# Patient Record
Sex: Female | Born: 1945 | Race: White | Hispanic: No | Smoking: Never smoker
Health system: Southern US, Community
[De-identification: ages and names within clinical notes are randomized; demographics above are authoritative.]

## PROBLEM LIST (undated history)

## (undated) DIAGNOSIS — I509 Heart failure, unspecified: Secondary | ICD-10-CM

## (undated) DIAGNOSIS — I480 Paroxysmal atrial fibrillation: Secondary | ICD-10-CM

## (undated) DIAGNOSIS — I10 Essential (primary) hypertension: Secondary | ICD-10-CM

## (undated) DIAGNOSIS — E119 Type 2 diabetes mellitus without complications: Secondary | ICD-10-CM

## (undated) DIAGNOSIS — I639 Cerebral infarction, unspecified: Secondary | ICD-10-CM

## (undated) HISTORY — PX: FEMUR SURGERY: SHX943

## (undated) HISTORY — PX: CHOLECYSTECTOMY: SHX55

## (undated) HISTORY — PX: TUBAL LIGATION: SHX77

## (undated) HISTORY — PX: HERNIA REPAIR: SHX51

## (undated) HISTORY — PX: CARPAL TUNNEL RELEASE: SHX101

---

## 2006-01-14 ENCOUNTER — Ambulatory Visit (HOSPITAL_COMMUNITY): Admission: RE | Admit: 2006-01-14 | Discharge: 2006-01-14 | Payer: Self-pay | Admitting: Ophthalmology

## 2006-02-18 ENCOUNTER — Ambulatory Visit (HOSPITAL_COMMUNITY): Admission: RE | Admit: 2006-02-18 | Discharge: 2006-02-18 | Payer: Self-pay | Admitting: Ophthalmology

## 2017-09-17 ENCOUNTER — Other Ambulatory Visit: Payer: Self-pay

## 2017-09-17 ENCOUNTER — Inpatient Hospital Stay (HOSPITAL_COMMUNITY)
Admission: EM | Admit: 2017-09-17 | Discharge: 2017-09-23 | DRG: 871 | Disposition: A | Discharge status: Home Health | Payer: Medicare Other | Attending: Internal Medicine | Admitting: Internal Medicine

## 2017-09-17 ENCOUNTER — Inpatient Hospital Stay (HOSPITAL_COMMUNITY): Payer: Medicare Other

## 2017-09-17 ENCOUNTER — Emergency Department (HOSPITAL_COMMUNITY): Payer: Medicare Other

## 2017-09-17 DIAGNOSIS — Z794 Long term (current) use of insulin: Secondary | ICD-10-CM

## 2017-09-17 DIAGNOSIS — I429 Cardiomyopathy, unspecified: Secondary | ICD-10-CM | POA: Diagnosis not present

## 2017-09-17 DIAGNOSIS — L8992 Pressure ulcer of unspecified site, stage 2: Secondary | ICD-10-CM | POA: Diagnosis not present

## 2017-09-17 DIAGNOSIS — N179 Acute kidney failure, unspecified: Secondary | ICD-10-CM | POA: Diagnosis not present

## 2017-09-17 DIAGNOSIS — A419 Sepsis, unspecified organism: Principal | ICD-10-CM

## 2017-09-17 DIAGNOSIS — E876 Hypokalemia: Secondary | ICD-10-CM | POA: Diagnosis not present

## 2017-09-17 DIAGNOSIS — N3000 Acute cystitis without hematuria: Secondary | ICD-10-CM

## 2017-09-17 DIAGNOSIS — Z87892 Personal history of anaphylaxis: Secondary | ICD-10-CM

## 2017-09-17 DIAGNOSIS — N183 Chronic kidney disease, stage 3 (moderate): Secondary | ICD-10-CM | POA: Diagnosis present

## 2017-09-17 DIAGNOSIS — N390 Urinary tract infection, site not specified: Secondary | ICD-10-CM | POA: Diagnosis present

## 2017-09-17 DIAGNOSIS — I129 Hypertensive chronic kidney disease with stage 1 through stage 4 chronic kidney disease, or unspecified chronic kidney disease: Secondary | ICD-10-CM | POA: Diagnosis present

## 2017-09-17 DIAGNOSIS — R6520 Severe sepsis without septic shock: Secondary | ICD-10-CM | POA: Diagnosis present

## 2017-09-17 DIAGNOSIS — L8990 Pressure ulcer of unspecified site, unspecified stage: Secondary | ICD-10-CM

## 2017-09-17 DIAGNOSIS — F0390 Unspecified dementia without behavioral disturbance: Secondary | ICD-10-CM | POA: Diagnosis present

## 2017-09-17 DIAGNOSIS — Z8249 Family history of ischemic heart disease and other diseases of the circulatory system: Secondary | ICD-10-CM

## 2017-09-17 DIAGNOSIS — I2510 Atherosclerotic heart disease of native coronary artery without angina pectoris: Secondary | ICD-10-CM | POA: Diagnosis present

## 2017-09-17 DIAGNOSIS — Z881 Allergy status to other antibiotic agents status: Secondary | ICD-10-CM

## 2017-09-17 DIAGNOSIS — G9340 Encephalopathy, unspecified: Secondary | ICD-10-CM

## 2017-09-17 DIAGNOSIS — L89152 Pressure ulcer of sacral region, stage 2: Secondary | ICD-10-CM | POA: Diagnosis present

## 2017-09-17 DIAGNOSIS — K219 Gastro-esophageal reflux disease without esophagitis: Secondary | ICD-10-CM | POA: Diagnosis present

## 2017-09-17 DIAGNOSIS — S72452D Displaced supracondylar fracture without intracondylar extension of lower end of left femur, subsequent encounter for closed fracture with routine healing: Secondary | ICD-10-CM

## 2017-09-17 DIAGNOSIS — K439 Ventral hernia without obstruction or gangrene: Secondary | ICD-10-CM | POA: Diagnosis present

## 2017-09-17 DIAGNOSIS — J9811 Atelectasis: Secondary | ICD-10-CM | POA: Diagnosis present

## 2017-09-17 DIAGNOSIS — I480 Paroxysmal atrial fibrillation: Secondary | ICD-10-CM | POA: Diagnosis present

## 2017-09-17 DIAGNOSIS — D509 Iron deficiency anemia, unspecified: Secondary | ICD-10-CM | POA: Diagnosis present

## 2017-09-17 DIAGNOSIS — R4702 Dysphasia: Secondary | ICD-10-CM | POA: Diagnosis present

## 2017-09-17 DIAGNOSIS — Z955 Presence of coronary angioplasty implant and graft: Secondary | ICD-10-CM

## 2017-09-17 DIAGNOSIS — I10 Essential (primary) hypertension: Secondary | ICD-10-CM | POA: Diagnosis present

## 2017-09-17 DIAGNOSIS — G9341 Metabolic encephalopathy: Secondary | ICD-10-CM | POA: Diagnosis present

## 2017-09-17 DIAGNOSIS — Z886 Allergy status to analgesic agent status: Secondary | ICD-10-CM | POA: Diagnosis not present

## 2017-09-17 DIAGNOSIS — R1310 Dysphagia, unspecified: Secondary | ICD-10-CM | POA: Diagnosis present

## 2017-09-17 DIAGNOSIS — R1011 Right upper quadrant pain: Secondary | ICD-10-CM

## 2017-09-17 DIAGNOSIS — E1122 Type 2 diabetes mellitus with diabetic chronic kidney disease: Secondary | ICD-10-CM | POA: Diagnosis present

## 2017-09-17 DIAGNOSIS — I639 Cerebral infarction, unspecified: Secondary | ICD-10-CM

## 2017-09-17 DIAGNOSIS — Z888 Allergy status to other drugs, medicaments and biological substances status: Secondary | ICD-10-CM

## 2017-09-17 DIAGNOSIS — Z91048 Other nonmedicinal substance allergy status: Secondary | ICD-10-CM

## 2017-09-17 DIAGNOSIS — Z8673 Personal history of transient ischemic attack (TIA), and cerebral infarction without residual deficits: Secondary | ICD-10-CM

## 2017-09-17 DIAGNOSIS — Z9049 Acquired absence of other specified parts of digestive tract: Secondary | ICD-10-CM

## 2017-09-17 DIAGNOSIS — R410 Disorientation, unspecified: Secondary | ICD-10-CM | POA: Diagnosis not present

## 2017-09-17 DIAGNOSIS — Z7901 Long term (current) use of anticoagulants: Secondary | ICD-10-CM

## 2017-09-17 DIAGNOSIS — Z882 Allergy status to sulfonamides status: Secondary | ICD-10-CM

## 2017-09-17 DIAGNOSIS — Z885 Allergy status to narcotic agent status: Secondary | ICD-10-CM

## 2017-09-17 HISTORY — DX: Heart failure, unspecified: I50.9

## 2017-09-17 HISTORY — DX: Essential (primary) hypertension: I10

## 2017-09-17 HISTORY — DX: Paroxysmal atrial fibrillation: I48.0

## 2017-09-17 HISTORY — DX: Cerebral infarction, unspecified: I63.9

## 2017-09-17 HISTORY — DX: Type 2 diabetes mellitus without complications: E11.9

## 2017-09-17 LAB — CBC WITH DIFFERENTIAL/PLATELET
Basophils Absolute: 0 10*3/uL (ref 0.0–0.1)
Basophils Relative: 0 %
EOS ABS: 0 10*3/uL (ref 0.0–0.7)
Eosinophils Relative: 0 %
HCT: 35.1 % — ABNORMAL LOW (ref 36.0–46.0)
Hemoglobin: 11.5 g/dL — ABNORMAL LOW (ref 12.0–15.0)
LYMPHS ABS: 1.8 10*3/uL (ref 0.7–4.0)
Lymphocytes Relative: 17 %
MCH: 27.6 pg (ref 26.0–34.0)
MCHC: 32.8 g/dL (ref 30.0–36.0)
MCV: 84.2 fL (ref 78.0–100.0)
Monocytes Absolute: 0.6 10*3/uL (ref 0.1–1.0)
Monocytes Relative: 6 %
Neutro Abs: 8 10*3/uL — ABNORMAL HIGH (ref 1.7–7.7)
Neutrophils Relative %: 77 %
Platelets: 245 10*3/uL (ref 150–400)
RBC: 4.17 MIL/uL (ref 3.87–5.11)
RDW: 19.5 % — ABNORMAL HIGH (ref 11.5–15.5)
WBC: 10.4 10*3/uL (ref 4.0–10.5)

## 2017-09-17 LAB — URINALYSIS, ROUTINE W REFLEX MICROSCOPIC
Bilirubin Urine: NEGATIVE
Glucose, UA: NEGATIVE mg/dL
Ketones, ur: 5 mg/dL — AB
Nitrite: NEGATIVE
PROTEIN: 30 mg/dL — AB
Specific Gravity, Urine: 1.019 (ref 1.005–1.030)
pH: 5 (ref 5.0–8.0)

## 2017-09-17 LAB — COMPREHENSIVE METABOLIC PANEL
ALK PHOS: 151 U/L — AB (ref 38–126)
ALT: 42 U/L (ref 14–54)
AST: 133 U/L — AB (ref 15–41)
Albumin: 2.4 g/dL — ABNORMAL LOW (ref 3.5–5.0)
Anion gap: 15 (ref 5–15)
BUN: 20 mg/dL (ref 6–20)
CALCIUM: 8.3 mg/dL — AB (ref 8.9–10.3)
CHLORIDE: 97 mmol/L — AB (ref 101–111)
CO2: 26 mmol/L (ref 22–32)
CREATININE: 1.48 mg/dL — AB (ref 0.44–1.00)
GFR calc Af Amer: 41 mL/min — ABNORMAL LOW (ref >60)
GFR, EST NON AFRICAN AMERICAN: 35 mL/min — AB (ref >60)
Glucose, Bld: 128 mg/dL — ABNORMAL HIGH (ref 65–99)
Potassium: 4.8 mmol/L (ref 3.5–5.1)
Sodium: 138 mmol/L (ref 135–145)
Total Bilirubin: 1.9 mg/dL — ABNORMAL HIGH (ref 0.3–1.2)
Total Protein: 5.9 g/dL — ABNORMAL LOW (ref 6.5–8.1)

## 2017-09-17 LAB — I-STAT CG4 LACTIC ACID, ED
Lactic Acid, Venous: 2.71 mmol/L (ref 0.5–1.9)
Lactic Acid, Venous: 2.2 mmol/L (ref 0.5–1.9)

## 2017-09-17 MED ORDER — SODIUM CHLORIDE 0.9 % IV BOLUS (SEPSIS)
1000.0000 mL | Freq: Once | INTRAVENOUS | Status: AC
  Administered 2017-09-17: 1000 mL via INTRAVENOUS

## 2017-09-17 MED ORDER — PIPERACILLIN-TAZOBACTAM 3.375 G IVPB 30 MIN
3.3750 g | Freq: Once | INTRAVENOUS | Status: AC
  Administered 2017-09-17: 3.375 g via INTRAVENOUS
  Filled 2017-09-17: qty 50

## 2017-09-17 MED ORDER — SODIUM CHLORIDE 0.9 % IV BOLUS (SEPSIS)
500.0000 mL | Freq: Once | INTRAVENOUS | Status: AC
  Administered 2017-09-17: 500 mL via INTRAVENOUS

## 2017-09-17 MED ORDER — HALOPERIDOL LACTATE 5 MG/ML IJ SOLN
2.0000 mg | Freq: Once | INTRAMUSCULAR | Status: AC
  Administered 2017-09-17: 2 mg via INTRAVENOUS
  Filled 2017-09-17: qty 1

## 2017-09-17 MED ORDER — VANCOMYCIN HCL IN DEXTROSE 1-5 GM/200ML-% IV SOLN
1000.0000 mg | Freq: Once | INTRAVENOUS | Status: AC
  Administered 2017-09-17: 1000 mg via INTRAVENOUS
  Filled 2017-09-17: qty 200

## 2017-09-17 NOTE — ED Triage Notes (Signed)
Per EMS: Pt was at [facility] [place]. Pt had a PICC line and ripped it out. Pt had dark foul smelling urine in small amounts. The facility was unable to maintain IV access and it is unsure if they ever finished antibiotics for the IV  O2 95% on RA End title CO2 12-15 CBG 176  Pt is screaming into the hall and not making sense at this time. Pt has had 9 UTI's in the past 6 month. Pt also has a rod in her leg from a femur fracture approximately 6 months ago.

## 2017-09-17 NOTE — ED Provider Notes (Signed)
[facility]-EMERGENCY DEPT Provider Note   CSN: [id number] Arrival date & time: 09/17/17  1952     History   Chief Complaint Chief Complaint  Patient presents with  . Altered Mental Status    HPI Stacy Dorsey is a 72 y.o. female.  HPI   72 year old female here with altered mental status.  Patient has an extensive history of recurrent delirium secondary to UTIs.  She is been hospitalized at [facility] multiple times at [facility] for this.  Recently, the patient just returned home from a skilled nursing facility 4 days ago.  Since then, she is had recurrence of delirium, agitation, and confusion.  She is been unable to walk.  She has had associated intermittent fevers.  She is not eating and drinking.  She has not been sleeping as well.  Family subsequently brings her in for further evaluation.  She has not complained of any pain.  Level 5 caveat invoked as remainder of history, ROS, and physical exam limited due to patient's AMS.   No past medical history on file.   Osteoarthritis Mild dementia Recent L hip fx s/p repair   Patient Active Problem List   Diagnosis Date Noted  . Sepsis (HCC) 09/17/2017      OB History    No data available       Home Medications    Prior to Admission medications   Medication Sig Start Date End Date Taking? Authorizing Provider  allopurinol (ZYLOPRIM) 100 MG tablet Take 100 mg by mouth daily.  09/13/17  Yes [provider]  atorvastatin (LIPITOR) 80 MG tablet Take 80 mg by mouth daily.  09/13/17  Yes [provider]  dorzolamide (TRUSOPT) 2 % ophthalmic solution Place 1 drop into both eyes 3 (three) times daily.  09/13/17  Yes [provider]  ELIQUIS 5 MG TABS tablet Take 5 mg by mouth 2 (two) times daily.  09/13/17  Yes [provider]  metoprolol tartrate (LOPRESSOR) 25 MG tablet Take 12.5 mg by mouth 2 (two) times daily.  09/13/17  Yes [provider]  NOVOLOG 100 UNIT/ML  injection Inject 0-12 Units into the skin 4 (four) times daily. 0-150=0 units, 151-200=2 units, 201-250=4 units, 251-300=6 units, 301-350=8 units, 351-400=10 units, 401-450=12 units. 09/13/17  Yes [provider]  OLANZapine (ZYPREXA) 5 MG tablet Take 5 mg by mouth daily.  09/13/17  Yes [provider]  omeprazole (PRILOSEC) 20 MG capsule Take 20 mg by mouth 2 (two) times daily before a meal.  09/13/17  Yes [provider]  nitroGLYCERIN (NITROSTAT) 0.4 MG SL tablet Place 0.4 mg under the tongue every 5 (five) minutes as needed for chest pain.  09/13/17   [provider]    Family History No family history on file.  Social History Social History   Tobacco Use  . Smoking status: Not on file  Substance Use Topics  . Alcohol use: Not on file  . Drug use: Not on file     Allergies   Allegra [fexofenadine]; Carvedilol; Sulfamethoxazole-trimethoprim; Sulfanilamide; Adhesive [tape]; Ceftriaxone; Cephalexin; Clindamycin/lincomycin; Clopidogrel; Ezetimibe; Haloperidol and related; Hydrocodone; Insulin degludec; Insulin glargine; Isosorbide mononitrate [isosorbide dinitrate er]; Moxifloxacin; Other; Prasugrel; Ramipril; Rosuvastatin; Tylenol [acetaminophen]; Atorvastatin calcium; and Minocycline   Review of Systems Review of Systems  Unable to perform ROS: Mental status change  Constitutional: Positive for fatigue and fever.  Psychiatric/Behavioral: Positive for confusion.     Physical Exam Updated Vital Signs BP 109/73 (BP Location: Left Arm)   Pulse Marland Kitchen)  108   Temp (!) 101.1 F (38.4 C) (Rectal)   Resp 15   Ht 5\' 5"  (1.651 m)   Wt 78 kg (172 lb)   SpO2 97%   BMI 28.62 kg/m   Physical Exam  Constitutional: She appears well-developed.  Elderly, appears older than stated age, distressed, delirious  HENT:  Head: Normocephalic and atraumatic.  Markedly dry mucous membranes  Eyes: Conjunctivae are normal.  Neck: Neck supple.  Cardiovascular: Regular  rhythm and normal heart sounds. Exam reveals no friction rub.  No murmur heard. Mildly tachycardic  Pulmonary/Chest: Effort normal. No respiratory distress. She has no wheezes. She has rales (Bibasilar).  Abdominal: Soft. She exhibits no distension.  Musculoskeletal: She exhibits edema (Trace, bilateral lower extremities).  Neurological: She is alert. She exhibits normal muscle tone.  Alert, but markedly delirious.  Waxing and waning mental status.  Speech appears normal.  Moving all extremities.  Skin: Skin is warm. Capillary refill takes less than 2 seconds.  Left femur surgical scars clean, dry, and intact  Nursing note and vitals reviewed.    ED Treatments / Results  Labs (all labs ordered are listed, but only abnormal results are displayed) Labs Reviewed  COMPREHENSIVE METABOLIC PANEL - Abnormal; Notable for the following components:      Result Value   Chloride 97 (*)    Glucose, Bld 128 (*)    Creatinine, Ser 1.48 (*)    Calcium 8.3 (*)    Total Protein 5.9 (*)    Albumin 2.4 (*)    AST 133 (*)    Alkaline Phosphatase 151 (*)    Total Bilirubin 1.9 (*)    GFR calc non Af Amer 35 (*)    GFR calc Af Amer 41 (*)    All other components within normal limits  CBC WITH DIFFERENTIAL/PLATELET - Abnormal; Notable for the following components:   Hemoglobin 11.5 (*)    HCT 35.1 (*)    RDW 19.5 (*)    Neutro Abs 8.0 (*)    All other components within normal limits  URINALYSIS, ROUTINE W REFLEX MICROSCOPIC - Abnormal; Notable for the following components:   Color, Urine AMBER (*)    APPearance CLOUDY (*)    Hgb urine dipstick SMALL (*)    Ketones, ur 5 (*)    Protein, ur 30 (*)    Leukocytes, UA LARGE (*)    Bacteria, UA FEW (*)    Squamous Epithelial / LPF 6-30 (*)    Non Squamous Epithelial 0-5 (*)    All other components within normal limits  I-STAT CG4 LACTIC ACID, ED - Abnormal; Notable for the following components:   Lactic Acid, Venous 2.71 (*)    All other  components within normal limits  I-STAT CG4 LACTIC ACID, ED - Abnormal; Notable for the following components:   Lactic Acid, Venous 2.20 (*)    All other components within normal limits  CULTURE, BLOOD (ROUTINE X 2)  CULTURE, BLOOD (ROUTINE X 2)  URINE CULTURE    EKG  EKG Interpretation  Date/Time:  Tuesday September 17 2017 21:57:27 EST Ventricular Rate:  103 PR Interval:    QRS Duration: 121 QT Interval:  389 QTC Calculation: 510 R Axis:   -98 Text Interpretation:  Sinus tachycardia Ventricular premature complex Aberrant conduction of SV complex(es) IVCD, consider atypical RBBB Inferior infarct, old Abnormal lateral Q waves Anterior infarct, old No significant change since last tracing Confirmed by Shaune Pollack [phone]) on 09/17/2017 11:24:33 PM  Radiology Dg Chest Port 1 View  Result Date: 09/17/2017 CLINICAL DATA:  Altered mental status.  Sepsis. EXAM: PORTABLE CHEST 1 VIEW COMPARISON:  08/08/2017 FINDINGS: Patient rotated left. Artifact projects over the medial upper right lung. Cardiomegaly accentuated by AP portable technique. Probable small bilateral pleural effusions. No pneumothorax. Persistent bibasilar airspace disease. Low lung volumes with resultant pulmonary interstitial prominence. IMPRESSION: Probable small bilateral pleural effusions. Bibasilar airspace disease could represent persistent or recurrent atelectasis versus infection. Cardiomegaly and low lung volumes.  No overt congestive failure. Electronically Signed   By: Jeronimo Greaves M.D.   On: 09/17/2017 20:43   US Abdomen Limited Ruq  Result Date: 09/17/2017 CLINICAL DATA:  Right upper quadrant pain. EXAM: ULTRASOUND ABDOMEN LIMITED RIGHT UPPER QUADRANT COMPARISON:  None. FINDINGS: Gallbladder: Surgically absent. Common bile duct: Diameter: 6 mm. Liver: Technically limited exam due to patient motion and inability to hold position. No focal lesion identified. Grossly within normal limits in parenchymal echogenicity.  Portal vein is patent on color Doppler imaging with normal direction of blood flow towards the liver. IMPRESSION: 1. Postcholecystectomy without biliary dilatation. 2. No gross focal hepatic abnormality demonstrated sonographically. Electronically Signed   By: Rubye Oaks M.D.   On: 09/17/2017 23:44    Procedures .Critical Care Performed by: Shaune Pollack, MD Authorized by: Shaune Pollack, MD   Critical care provider statement:    Critical care time (minutes):  45   Critical care time was exclusive of:  Separately billable procedures and treating other patients and teaching time   Critical care was necessary to treat or prevent imminent or life-threatening deterioration of the following conditions:  Circulatory failure, sepsis and dehydration   Critical care was time spent personally by me on the following activities:  Development of treatment plan with patient or surrogate, discussions with consultants, evaluation of patient's response to treatment, examination of patient, obtaining history from patient or surrogate, ordering and performing treatments and interventions, ordering and review of laboratory studies, ordering and review of radiographic studies, pulse oximetry, re-evaluation of patient's condition and review of old charts   I assumed direction of critical care for this patient from another provider in my specialty: no     (including critical care time)  Emergency Ultrasound Study:   Angiocath insertion Performed by: Dollene Cleveland Consent: Verbal consent/emergent consent obtained. Risks and benefits: risks, benefits and alternatives were discussed Immediately prior to procedure the correct patient, procedure, equipment, support staff and site/side marked as needed.  Indication: difficult IV access Preparation: Patient was prepped and draped in the usual sterile fashion. Sterile gel was used for this procedure and the ultrasound probe was sterilized prior to use. Vein  Location: Right upper arm vein was visualized during assessment for potential access sites and was found to be patent/ easily compressed with linear ultrasound.  The needle was visualized with real-time ultrasound and guided into the vein. Gauge: 20 Image saved and stored.  Normal blood return.   Patient tolerance: Patient tolerated the procedure well with no immediate complications.  Emergency Ultrasound Study:   Angiocath insertion Performed by: Dollene Cleveland Consent: Verbal consent/emergent consent obtained. Risks and benefits: risks, benefits and alternatives were discussed Immediately prior to procedure the correct patient, procedure, equipment, support staff and site/side marked as needed.  Indication: difficult IV access Preparation: Patient was prepped and draped in the usual sterile fashion. Sterile gel was used for this procedure and the ultrasound probe was sterilized prior to use. Vein Location: Right forearm vein was visualized during assessment  for potential access sites and was found to be patent/ easily compressed with linear ultrasound.  The needle was visualized with real-time ultrasound and guided into the vein. Gauge: 20  Image saved and stored.  Normal blood return.   Patient tolerance: Patient tolerated the procedure well with no immediate complications.           Medications Ordered in ED Medications  sodium chloride 0.9 % bolus 500 mL (not administered)  sodium chloride 0.9 % bolus 1,000 mL (0 mLs Intravenous Stopped 09/17/17 2223)  piperacillin-tazobactam (ZOSYN) IVPB 3.375 g (0 g Intravenous Stopped 09/17/17 2146)  vancomycin (VANCOCIN) IVPB 1000 mg/200 mL premix (0 mg Intravenous Stopped 09/17/17 2309)  sodium chloride 0.9 % bolus 1,000 mL (0 mLs Intravenous Stopped 09/17/17 2230)  haloperidol lactate (HALDOL) injection 2 mg (2 mg Intravenous Given 09/17/17 2131)     Initial Impression / Assessment and Plan / ED Course  I have reviewed the triage vital  signs and the nursing notes.  Pertinent labs & imaging results that were available during my care of the patient were reviewed by me and considered in my medical decision making (see chart for details).    72 yo F with PMHx recurrent UTI, recent L femoral fx s/p repair, here with AMS. Pt febrile, tachycardic, mildly hypotensive on arrival. CODE SEPSIS initiated, but 30 cc/kg initially delayed 2/2 difficult IV access. I placed a R upper arm IV that obtained labs, received fluids briefly but pt grabbed and removed. Second IV placed by myself. Left hand IV placed by EMS. Concern for sepsis 2/2 UTI, with profound delirium. No focal neuro deficits, no recent falls or s/s to suggest intracranial abnormality. Pt has h/o similar presentation 2/2 UTI. Will continue fluids, broad ABX, and re-assess.  Labs show moderate lactic acidosis, AKI. Pt given fluids. Of note, tp with elevated bili, AST -suspect this is 2/2 sepsis and dehydration, but RUQ U/S ordered. Will also check CT per Hospitalist, though abdomen is soft, NT, ND. Labs are o/w consistent with UTI and severe sepsis. Fluids given.  Sepsis re-eval completed. Pt improving with perfusion, BP stable, HR improving. She remains confused. Unable to give tylenol for fever 2/2 allergy. Admit to step down. Of note, gave 2 mg haldol with moderate effect but hesitant to give more 2/2 prolonged QT. Pt has had paradoxical reaction to Ativan in past.   Final Clinical Impressions(s) / ED Diagnoses   Final diagnoses:  RUQ pain  Severe sepsis (HCC)  Acute cystitis without hematuria  Delirium    ED Discharge Orders    None       Shaune PollackIsaacs, Jeriah Skufca, MD 09/18/17 [phone]

## 2017-09-17 NOTE — Progress Notes (Signed)
A consult was received from an ED physician for vancomycin and zosyn per pharmacy dosing.  The patient's profile has been reviewed for ht/wt/allergies/indication/available labs.   No ht/wt in Epic. No labs in Epic.  A one time order has been placed for vancomycin 1 gm and zosyn 3.375 gm.    Further antibiotics/pharmacy consults should be ordered by admitting physician if indicated.                       Thank you, Herby AbrahamMichelle T. Narciso Stoutenburg, Pharm.D. [phone] 09/17/2017 9:49 PM

## 2017-09-17 NOTE — ED Notes (Signed)
MD, RN, and IV team had multiple sticks on pt. RN stuck x2, MD stuck x2 and IV Team stuck x2.   Pt has 1 IV that infiltrated and was removed in upper right forearm.   Pt now has 2 IV's. A 22g in the LFA and a 20g in the RFA

## 2017-09-17 NOTE — ED Notes (Signed)
Bed: WA21 Expected date:  Expected time:  Means of arrival:  Comments: 72 yr old UTI

## 2017-09-18 ENCOUNTER — Other Ambulatory Visit: Payer: Self-pay

## 2017-09-18 ENCOUNTER — Inpatient Hospital Stay (HOSPITAL_COMMUNITY): Payer: Medicare Other

## 2017-09-18 ENCOUNTER — Encounter (HOSPITAL_COMMUNITY): Payer: Self-pay | Admitting: Internal Medicine

## 2017-09-18 DIAGNOSIS — I10 Essential (primary) hypertension: Secondary | ICD-10-CM | POA: Diagnosis present

## 2017-09-18 DIAGNOSIS — I2510 Atherosclerotic heart disease of native coronary artery without angina pectoris: Secondary | ICD-10-CM | POA: Diagnosis present

## 2017-09-18 DIAGNOSIS — I429 Cardiomyopathy, unspecified: Secondary | ICD-10-CM

## 2017-09-18 DIAGNOSIS — N390 Urinary tract infection, site not specified: Secondary | ICD-10-CM | POA: Diagnosis present

## 2017-09-18 DIAGNOSIS — A419 Sepsis, unspecified organism: Principal | ICD-10-CM

## 2017-09-18 DIAGNOSIS — G9340 Encephalopathy, unspecified: Secondary | ICD-10-CM

## 2017-09-18 LAB — IRON AND TIBC
IRON: 26 ug/dL — AB (ref 28–170)
Saturation Ratios: 12 % (ref 10.4–31.8)
TIBC: 213 ug/dL — ABNORMAL LOW (ref 250–450)
UIBC: 187 ug/dL

## 2017-09-18 LAB — COMPREHENSIVE METABOLIC PANEL
ALBUMIN: 2.3 g/dL — AB (ref 3.5–5.0)
ALT: 42 U/L (ref 14–54)
AST: 133 U/L — ABNORMAL HIGH (ref 15–41)
Alkaline Phosphatase: 146 U/L — ABNORMAL HIGH (ref 38–126)
Anion gap: 14 (ref 5–15)
BUN: 20 mg/dL (ref 6–20)
CHLORIDE: 100 mmol/L — AB (ref 101–111)
CO2: 23 mmol/L (ref 22–32)
Calcium: 8.2 mg/dL — ABNORMAL LOW (ref 8.9–10.3)
Creatinine, Ser: 1.34 mg/dL — ABNORMAL HIGH (ref 0.44–1.00)
GFR calc Af Amer: 46 mL/min — ABNORMAL LOW (ref >60)
GFR calc non Af Amer: 39 mL/min — ABNORMAL LOW (ref >60)
GLUCOSE: 144 mg/dL — AB (ref 65–99)
POTASSIUM: 3.3 mmol/L — AB (ref 3.5–5.1)
Sodium: 137 mmol/L (ref 135–145)
Total Bilirubin: 1.5 mg/dL — ABNORMAL HIGH (ref 0.3–1.2)
Total Protein: 5.9 g/dL — ABNORMAL LOW (ref 6.5–8.1)

## 2017-09-18 LAB — CBG MONITORING, ED
GLUCOSE-CAPILLARY: 85 mg/dL (ref 65–99)
GLUCOSE-CAPILLARY: 121 mg/dL — AB (ref 65–99)
Glucose-Capillary: 142 mg/dL — ABNORMAL HIGH (ref 65–99)
Glucose-Capillary: 138 mg/dL — ABNORMAL HIGH (ref 65–99)
Glucose-Capillary: 164 mg/dL — ABNORMAL HIGH (ref 65–99)
Glucose-Capillary: 146 mg/dL — ABNORMAL HIGH (ref 65–99)

## 2017-09-18 LAB — CBC WITH DIFFERENTIAL/PLATELET
BASOS ABS: 0 10*3/uL (ref 0.0–0.1)
BASOS PCT: 1 %
EOS ABS: 0.1 10*3/uL (ref 0.0–0.7)
EOS PCT: 1 %
HCT: 34.1 % — ABNORMAL LOW (ref 36.0–46.0)
Hemoglobin: 10.8 g/dL — ABNORMAL LOW (ref 12.0–15.0)
Lymphocytes Relative: 22 %
Lymphs Abs: 1.8 10*3/uL (ref 0.7–4.0)
MCH: 27.2 pg (ref 26.0–34.0)
MCHC: 31.7 g/dL (ref 30.0–36.0)
MCV: 85.9 fL (ref 78.0–100.0)
MONO ABS: 0.7 10*3/uL (ref 0.1–1.0)
Monocytes Relative: 8 %
NEUTROS ABS: 5.6 10*3/uL (ref 1.7–7.7)
Neutrophils Relative %: 68 %
PLATELETS: 212 10*3/uL (ref 150–400)
RBC: 3.97 MIL/uL (ref 3.87–5.11)
RDW: 19.6 % — AB (ref 11.5–15.5)
WBC: 8.2 10*3/uL (ref 4.0–10.5)

## 2017-09-18 LAB — MRSA PCR SCREENING: MRSA by PCR: POSITIVE — AB

## 2017-09-18 LAB — RETICULOCYTES
RBC.: 3.95 MIL/uL (ref 3.87–5.11)
Retic Count, Absolute: 138.3 10*3/uL (ref 19.0–186.0)
Retic Ct Pct: 3.5 % — ABNORMAL HIGH (ref 0.4–3.1)

## 2017-09-18 LAB — AMMONIA: AMMONIA: 24 umol/L (ref 9–35)

## 2017-09-18 LAB — MAGNESIUM: MAGNESIUM: 1.8 mg/dL (ref 1.7–2.4)

## 2017-09-18 LAB — PROCALCITONIN: Procalcitonin: 0.13 ng/mL

## 2017-09-18 LAB — LACTIC ACID, PLASMA: LACTIC ACID, VENOUS: 1.8 mmol/L (ref 0.5–1.9)

## 2017-09-18 LAB — FOLATE: FOLATE: 25.8 ng/mL (ref >5.9)

## 2017-09-18 LAB — FERRITIN: FERRITIN: 224 ng/mL (ref 11–307)

## 2017-09-18 LAB — VITAMIN B12: Vitamin B-12: 1410 pg/mL — ABNORMAL HIGH (ref 180–914)

## 2017-09-18 LAB — TSH: TSH: 5.117 u[IU]/mL — AB (ref 0.350–4.500)

## 2017-09-18 MED ORDER — POTASSIUM CHLORIDE 20 MEQ PO PACK
40.0000 meq | PACK | Freq: Once | ORAL | Status: AC
  Administered 2017-09-18: 40 meq via ORAL
  Filled 2017-09-18: qty 2

## 2017-09-18 MED ORDER — ONDANSETRON HCL 4 MG PO TABS: 4.0000 mg | ORAL_TABLET | Freq: Four times a day (QID) | ORAL | Status: DC | PRN

## 2017-09-18 MED ORDER — OLANZAPINE 10 MG IM SOLR
5.0000 mg | Freq: Once | INTRAMUSCULAR | Status: AC
  Administered 2017-09-18: 5 mg via INTRAMUSCULAR
  Filled 2017-09-18: qty 10

## 2017-09-18 MED ORDER — OLANZAPINE 5 MG PO TABS
5.0000 mg | ORAL_TABLET | Freq: Every day | ORAL | Status: DC
  Administered 2017-09-18 – 2017-09-23 (×5): 5 mg via ORAL
  Filled 2017-09-18 (×6): qty 1

## 2017-09-18 MED ORDER — VANCOMYCIN HCL IN DEXTROSE 1-5 GM/200ML-% IV SOLN
1000.0000 mg | INTRAVENOUS | Status: DC
  Administered 2017-09-18: 1000 mg via INTRAVENOUS
  Filled 2017-09-18: qty 200

## 2017-09-18 MED ORDER — MUPIROCIN 2 % EX OINT
1.0000 "application " | TOPICAL_OINTMENT | Freq: Two times a day (BID) | CUTANEOUS | Status: AC
  Administered 2017-09-19 – 2017-09-23 (×10): 1 via NASAL
  Filled 2017-09-18: qty 22

## 2017-09-18 MED ORDER — PANTOPRAZOLE SODIUM 40 MG PO TBEC
40.0000 mg | DELAYED_RELEASE_TABLET | Freq: Every day | ORAL | Status: DC
  Administered 2017-09-18 – 2017-09-23 (×5): 40 mg via ORAL
  Filled 2017-09-18 (×5): qty 1

## 2017-09-18 MED ORDER — MAGNESIUM SULFATE 4 GM/100ML IV SOLN
4.0000 g | Freq: Once | INTRAVENOUS | Status: AC
  Administered 2017-09-18: 4 g via INTRAVENOUS
  Filled 2017-09-18: qty 100

## 2017-09-18 MED ORDER — HYDRALAZINE HCL 20 MG/ML IJ SOLN: 10.0000 mg | INTRAMUSCULAR | Status: DC | PRN

## 2017-09-18 MED ORDER — ONDANSETRON HCL 4 MG/2ML IJ SOLN: 4.0000 mg | Freq: Four times a day (QID) | INTRAMUSCULAR | Status: DC | PRN

## 2017-09-18 MED ORDER — DORZOLAMIDE HCL 2 % OP SOLN
1.0000 [drp] | Freq: Three times a day (TID) | OPHTHALMIC | Status: DC
  Administered 2017-09-19 – 2017-09-23 (×14): 1 [drp] via OPHTHALMIC
  Filled 2017-09-18: qty 10

## 2017-09-18 MED ORDER — SODIUM CHLORIDE 0.9 % IV SOLN
INTRAVENOUS | Status: AC
  Administered 2017-09-18: 05:00:00 via INTRAVENOUS

## 2017-09-18 MED ORDER — INSULIN ASPART 100 UNIT/ML ~~LOC~~ SOLN
0.0000 [IU] | SUBCUTANEOUS | Status: DC
  Administered 2017-09-18 (×3): 1 [IU] via SUBCUTANEOUS
  Administered 2017-09-18: 2 [IU] via SUBCUTANEOUS
  Administered 2017-09-19 (×2): 1 [IU] via SUBCUTANEOUS
  Filled 2017-09-18 (×3): qty 1

## 2017-09-18 MED ORDER — CHLORHEXIDINE GLUCONATE CLOTH 2 % EX PADS
6.0000 | MEDICATED_PAD | Freq: Every day | CUTANEOUS | Status: AC
  Administered 2017-09-19 – 2017-09-23 (×5): 6 via TOPICAL

## 2017-09-18 MED ORDER — PIPERACILLIN-TAZOBACTAM 3.375 G IVPB
3.3750 g | Freq: Three times a day (TID) | INTRAVENOUS | Status: DC
  Administered 2017-09-18 – 2017-09-20 (×7): 3.375 g via INTRAVENOUS
  Filled 2017-09-18 (×7): qty 50

## 2017-09-18 NOTE — Progress Notes (Signed)
Pt had 9 beats of Vtach, N.P. Informed. Will continue to monitor.

## 2017-09-18 NOTE — ED Notes (Signed)
Pt is still extremely agitated, trying to get out of bed, pulling on the lines, cursing at the staff.

## 2017-09-18 NOTE — ED Notes (Signed)
Pt is very agitated, sitter at the bedside, pt is trying to get out of bed, she is trying to hit nursing staff, cursing, she is alert and disoriented x 4, can't be redirected. Will continue to monitor.

## 2017-09-18 NOTE — Progress Notes (Signed)
I have seen and assessed patient and I agree with Dr. Katherene PontoKakrakandy's assessment and plan.  Patient is a 72 year old female history of cardiomyopathy, diabetes, hypertension recently discharged from rehab 4 days prior to admission after being treated for left femur fracture presented to the ED with worsening confusion.  Patient also noted to be tachycardic with a fever.  Patient also noted to have elevated lactic acid level.  Patient pancultured.  Urinalysis worrisome for UTI.  Patient placed empirically on IV antibiotics.  Flu panel was negative.  Patient more alert to self and place however does not know what year it is or what month it is.  Patient with some bouts of confusion however more alert than on admission.  Patient asking for ice.  Will place patient on a clear liquid diet and follow.  CT head negative.  No charge.

## 2017-09-18 NOTE — H&P (Addendum)
History and Physical    Stacy Dorsey ZOX:[id number] DOB: May 15, 1946 DOA: 09/17/2017  PCP: Patient, No Pcp Per  Patient coming from: Home.  Chief Complaint: Altered mental status.  Most of the history was obtained from ER physician and care everywhere.  No family at the bedside.  Unable to reach family through the phone.  HPI: Stacy Dorsey is a 72 y.o. female with history of cardiomyopathy, diabetes mellitus, hypertension was recently discharged from rehab 4 days ago after being treated for left femur fracture was brought to the ER because of increasing confusion.  Since her discharge from the rehab patient had become more delirious not eating well with fever and chills.  Patient has had recurrent episodes of delirium during hospitalization and also patient has urinary tract infection as per the report.    ED Course: In the ER patient is tachycardic fever of 101.  Patient's chest x-ray was unremarkable.  Lactate levels were elevated and had leukocytosis.  UA shows features consistent with UTI.  Since LFTs were elevated sonogram of the right upper quadrant was done which was unremarkable.  CT head and CT abdomen are pending.  Patient was started on empiric antibiotics and fluids and admitted for acute encephalopathy with edema and possible developing sepsis.  Flu panel is negative.  Review of Systems: As per HPI, rest all negative.   Past Medical History:  Diagnosis Date  . CHF (congestive heart failure) (HCC)   . Diabetes mellitus without complication (HCC)   . Hypertension     Past Surgical History:  Procedure Laterality Date  . CARPAL TUNNEL RELEASE    . CHOLECYSTECTOMY    . FEMUR SURGERY    . HERNIA REPAIR    . TUBAL LIGATION       reports that  has never smoked. she has never used smokeless tobacco. She reports that she does not drink alcohol or use drugs.  Allergies  Allergen Reactions  . Allegra [Fexofenadine] Anaphylaxis  . Carvedilol Anaphylaxis  .  Sulfamethoxazole-Trimethoprim Anaphylaxis  . Sulfanilamide Anaphylaxis  . Adhesive [Tape]     Causes Blisters.  . Ceftriaxone Diarrhea  . Cephalexin Hives  . Clindamycin/Lincomycin Hives  . Clopidogrel   . Ezetimibe Nausea Only  . Haloperidol And Related Other (See Comments)    Confusion  . Hydrocodone Hives  . Insulin Degludec     Heart Flutters  . Insulin Glargine     Headache  . Isosorbide Mononitrate [Isosorbide Dinitrate Er]     Excessive sleep  . Moxifloxacin   . Other     Phenylpropanolamin-Hydrocodone  . Prasugrel     Bleeding  . Ramipril   . Rosuvastatin Hives    Causes her heart to race and pounding in the chest.  . Tylenol [Acetaminophen]   . Atorvastatin Calcium Palpitations  . Minocycline Rash    Family History  Problem Relation Age of Onset  . CAD Mother   . Cancer Father     Prior to Admission medications   Medication Sig Start Date End Date Taking? Authorizing Provider  allopurinol (ZYLOPRIM) 100 MG tablet Take 100 mg by mouth daily.  09/13/17  Yes [provider]  atorvastatin (LIPITOR) 80 MG tablet Take 80 mg by mouth daily.  09/13/17  Yes [provider]  dorzolamide (TRUSOPT) 2 % ophthalmic solution Place 1 drop into both eyes 3 (three) times daily.  09/13/17  Yes [provider]  ELIQUIS 5 MG TABS tablet Take 5 mg by mouth 2 (two) times daily.  09/13/17  Yes [provider]  metoprolol tartrate (LOPRESSOR) 25 MG tablet Take 12.5 mg by mouth 2 (two) times daily.  09/13/17  Yes [provider]  NOVOLOG 100 UNIT/ML injection Inject 0-12 Units into the skin 4 (four) times daily. 0-150=0 units, 151-200=2 units, 201-250=4 units, 251-300=6 units, 301-350=8 units, 351-400=10 units, 401-450=12 units. 09/13/17  Yes [provider]  OLANZapine (ZYPREXA) 5 MG tablet Take 5 mg by mouth daily.  09/13/17  Yes [provider]  omeprazole (PRILOSEC) 20 MG capsule Take 20 mg by mouth 2 (two) times daily before a meal.   09/13/17  Yes [provider]  nitroGLYCERIN (NITROSTAT) 0.4 MG SL tablet Place 0.4 mg under the tongue every 5 (five) minutes as needed for chest pain.  09/13/17   [provider]    Physical Exam: Vitals:   09/17/17 2009 09/17/17 2225 09/17/17 2226 09/17/17 2230  BP: 90/76   109/73  Pulse: (!) 115   (!) 108  Resp: 20   15  Temp: 99.2 F (37.3 C)  (!) 101.1 F (38.4 C)   TempSrc: Oral  Rectal   SpO2: 98%   97%  Weight:  78 kg (172 lb)    Height:  5\' 5"  (1.651 m)        Constitutional: Moderately built and nourished. Vitals:   09/17/17 2009 09/17/17 2225 09/17/17 2226 09/17/17 2230  BP: 90/76   109/73  Pulse: (!) 115   (!) 108  Resp: 20   15  Temp: 99.2 F (37.3 C)  (!) 101.1 F (38.4 C)   TempSrc: Oral  Rectal   SpO2: 98%   97%  Weight:  78 kg (172 lb)    Height:  5\' 5"  (1.651 m)     Eyes: Anicteric no pallor. ENMT: No discharge from the ears eyes nose or mouth. Neck: No neck rigidity no mass felt. Respiratory: No rhonchi or crepitations. Cardiovascular: S1-S2 heard tachycardic. Abdomen: Soft nontender bowel sounds present. Musculoskeletal: No edema. Skin: No obvious rash. Neurologic: Patient is alert confused does not follow commands moves all extremities. Psychiatric: Patient is confused.    Labs on Admission: I have personally reviewed following labs and imaging studies  CBC: Recent Labs  Lab 09/17/17 2126  WBC 10.4  NEUTROABS 8.0*  HGB 11.5*  HCT 35.1*  MCV 84.2  PLT 245   Basic Metabolic Panel: Recent Labs  Lab 09/17/17 2126  NA 138  K 4.8  CL 97*  CO2 26  GLUCOSE 128*  BUN 20  CREATININE 1.48*  CALCIUM 8.3*   GFR: Estimated Creatinine Clearance: 37 mL/min (A) (by C-G formula based on SCr of 1.48 mg/dL (H)). Liver Function Tests: Recent Labs  Lab 09/17/17 2126  AST 133*  ALT 42  ALKPHOS 151*  BILITOT 1.9*  PROT 5.9*  ALBUMIN 2.4*   No results for input(s): LIPASE, AMYLASE in the last 168 hours. No results for  input(s): AMMONIA in the last 168 hours. Coagulation Profile: No results for input(s): INR, PROTIME in the last 168 hours. Cardiac Enzymes: No results for input(s): CKTOTAL, CKMB, CKMBINDEX, TROPONINI in the last 168 hours. BNP (last 3 results) No results for input(s): PROBNP in the last 8760 hours. HbA1C: No results for input(s): HGBA1C in the last 72 hours. CBG: No results for input(s): GLUCAP in the last 168 hours. Lipid Profile: No results for input(s): CHOL, HDL, LDLCALC, TRIG, CHOLHDL, LDLDIRECT in the last 72 hours. Thyroid Function Tests: No results for input(s): TSH, T4TOTAL, FREET4, T3FREE,  THYROIDAB in the last 72 hours. Anemia Panel: No results for input(s): VITAMINB12, FOLATE, FERRITIN, TIBC, IRON, RETICCTPCT in the last 72 hours. Urine analysis:    Component Value Date/Time   COLORURINE AMBER (A) 09/17/2017 2126   APPEARANCEUR CLOUDY (A) 09/17/2017 2126   LABSPEC 1.019 09/17/2017 2126   PHURINE 5.0 09/17/2017 2126   GLUCOSEU NEGATIVE 09/17/2017 2126   HGBUR SMALL (A) 09/17/2017 2126   BILIRUBINUR NEGATIVE 09/17/2017 2126   KETONESUR 5 (A) 09/17/2017 2126   PROTEINUR 30 (A) 09/17/2017 2126   NITRITE NEGATIVE 09/17/2017 2126   LEUKOCYTESUR LARGE (A) 09/17/2017 2126   Sepsis Labs: @LABRCNTIP (procalcitonin:4,lacticidven:4) )No results found for this or any previous visit (from the past 240 hour(s)).   Radiological Exams on Admission: Dg Chest Port 1 View  Result Date: 09/17/2017 CLINICAL DATA:  Altered mental status.  Sepsis. EXAM: PORTABLE CHEST 1 VIEW COMPARISON:  08/08/2017 FINDINGS: Patient rotated left. Artifact projects over the medial upper right lung. Cardiomegaly accentuated by AP portable technique. Probable small bilateral pleural effusions. No pneumothorax. Persistent bibasilar airspace disease. Low lung volumes with resultant pulmonary interstitial prominence. IMPRESSION: Probable small bilateral pleural effusions. Bibasilar airspace disease could  represent persistent or recurrent atelectasis versus infection. Cardiomegaly and low lung volumes.  No overt congestive failure. Electronically Signed   By: Jeronimo GreavesKyle  Talbot M.D.   On: 09/17/2017 20:43   [place] Abdomen Limited Ruq  Result Date: 09/17/2017 CLINICAL DATA:  Right upper quadrant pain. EXAM: ULTRASOUND ABDOMEN LIMITED RIGHT UPPER QUADRANT COMPARISON:  None. FINDINGS: Gallbladder: Surgically absent. Common bile duct: Diameter: 6 mm. Liver: Technically limited exam due to patient motion and inability to hold position. No focal lesion identified. Grossly within normal limits in parenchymal echogenicity. Portal vein is patent on color Doppler imaging with normal direction of blood flow towards the liver. IMPRESSION: 1. Postcholecystectomy without biliary dilatation. 2. No gross focal hepatic abnormality demonstrated sonographically. Electronically Signed   By: Rubye OaksMelanie  Ehinger M.D.   On: 09/17/2017 23:44    EKG: Independently reviewed.  Sinus tachycardia with IVCD.  Assessment/Plan Principal Problem:   Acute encephalopathy Active Problems:   Sepsis (HCC)   Acute lower UTI   CAD (coronary artery disease)   Cardiomyopathy (HCC)   Essential hypertension    1. Acute encephalopathy/acute delirium likely from infectious source at this time UA shows consistent with UTI -patient is placed on empiric antibiotics for now until blood cultures are available.  CT of the head is pending.  I have ordered 1 dose of Zyprexa 5 mg IM after discussing with pharmacy.  Closely observe.  Check ammonia levels. 2. Possible developing sepsis secondary to UTI-patient on empiric antibiotics follow cultures.  Continue hydration and noted that patient is having history of LV dysfunction with last EF measured was on 25% per care everywhere.  Closely follow respiratory status.  Follow lactate levels pro calcitonin levels. 3. Acute renal failure likely from poor oral intake -continue gentle hydration and follow metabolic panel.   CT abdomen pending. 4. Diabetes mellitus type 2-I do not see patient on any medications on the medication list with diabetes.  I have placed patient on sliding scale coverage for now. 5. History of CAD per the chart -we will check troponin. 6. Normocytic normochromic anemia appears to be new.  Follow CBC. 7. History of hypertension -presently having low normal blood pressure.  I have placed patient on PRN IV hydralazine for systolic blood pressure more than 160. 8. Elevated LFTs likely from developing sepsis.  Her albumin is low  not sure if patient has history of cirrhosis.  Follow LFTs.  CT abdomen is pending.  Sonogram of the right upper quadrant was unremarkable.  Follow ammonia levels.  Will check acute hepatitis panel.  For now patient is n.p.o.  Patient's medication list also notes that patient is on apixaban.  Not sure if patient has had any history of atrial fibrillation or not.  Need to discuss with family about it.  I do not see any mention of A. fib and cardiology notes in care everywhere.  For now patient is not on any anticoagulation until we get CT head.  DVT prophylaxis: SCDs until we get CT head results. Code Status: Full code.  Family Communication: Unable to reach family. Disposition Plan: To be determined. Consults called: None. Admission status: Inpatient.   Eduard Clos MD [facility] Pager [phone].  If 7PM-7AM, please contact night-coverage [email or web address] Password [email or web address]  09/18/2017, 1:21 AM

## 2017-09-18 NOTE — Progress Notes (Signed)
Pharmacy Antibiotic Note  Stacy ShorterJanice Dorsey is a 72 y.o. female admitted on 09/17/2017 with sepsis.  Pharmacy has been consulted for zosyn and vancomycin dosing.  Plan: Zosyn 3.375g IV q8h (4 hour infusion).  Vancomycin 1 Gm IV q36h for est AUC=503 Goal AUC = 400-500 Daily scr F/u cultures/levels  Height: 5\' 5"  (165.1 cm) Weight: 172 lb (78 kg) IBW/kg (Calculated) : 57  Temp (24hrs), Avg:100.2 F (37.9 C), Min:99.2 F (37.3 C), Max:101.1 F (38.4 C)  Recent Labs  Lab 09/17/17 2126 09/17/17 2130 09/17/17 2245  WBC 10.4  --   --   CREATININE 1.48*  --   --   LATICACIDVEN  --  2.71* 2.20*    Estimated Creatinine Clearance: 37 mL/min (A) (by C-G formula based on SCr of 1.48 mg/dL (H)).    Allergies  Allergen Reactions  . Allegra [Fexofenadine] Anaphylaxis  . Carvedilol Anaphylaxis  . Sulfamethoxazole-Trimethoprim Anaphylaxis  . Sulfanilamide Anaphylaxis  . Adhesive [Tape]     Causes Blisters.  . Ceftriaxone Diarrhea  . Cephalexin Hives  . Clindamycin/Lincomycin Hives  . Clopidogrel   . Ezetimibe Nausea Only  . Haloperidol And Related Other (See Comments)    Confusion  . Hydrocodone Hives  . Insulin Degludec     Heart Flutters  . Insulin Glargine     Headache  . Isosorbide Mononitrate [Isosorbide Dinitrate Er]     Excessive sleep  . Moxifloxacin   . Other     Phenylpropanolamin-Hydrocodone  . Prasugrel     Bleeding  . Ramipril   . Rosuvastatin Hives    Causes her heart to race and pounding in the chest.  . Tylenol [Acetaminophen]   . Atorvastatin Calcium Palpitations  . Minocycline Rash    Antimicrobials this admission: 3/5 zosyn >>  3/5 vancomycin >>   Dose adjustments this admission:   Microbiology results:  BCx:   UCx:    Sputum:    MRSA PCR:   Thank you for allowing pharmacy to be a part of this patient's care.  Stacy Dorsey, Stacy Dorsey 09/18/2017 1:42 AM

## 2017-09-18 NOTE — ED Notes (Signed)
Hospital phlebotomy here to collect labs ordered on the previous shift

## 2017-09-18 NOTE — ED Notes (Signed)
ED TO INPATIENT HANDOFF REPORT  Name/Age/Gender Stacy Dorsey 72 y.o. female  Code Status    Code Status Orders  (From admission, onward)        Start     Ordered   09/18/17 0110  Full code  Continuous     09/18/17 0111    Code Status History    Date Active Date Inactive Code Status Order ID Comments User Context   This patient has a current code status but no historical code status.      Home/SNF/Other Nursing Home  Chief Complaint Altered Mental Status  Level of Care/Admitting Diagnosis ED Disposition    ED Disposition Condition Comment   Admit  Hospital Area: [facility] [[id number]]  Level of Care: Stepdown [14]  Admit to SDU based on following criteria: Severe physiological/psychological symptoms:  Any diagnosis requiring assessment & intervention at least every 4 hours on an ongoing basis to obtain desired patient outcomes including stability and rehabilitation  Diagnosis: Acute encephalopathy [007622]  Admitting Physician: Rise Patience [phone]  Attending Physician: Rise Patience [phone]  Estimated length of stay: past midnight tomorrow  Certification:: I certify this patient will need inpatient services for at least 2 midnights  PT Class (Do Not Modify): Inpatient [101]  PT Acc Code (Do Not Modify): Private [1]       Medical History Past Medical History:  Diagnosis Date  . CHF (congestive heart failure) ([place])   . Diabetes mellitus without complication ([place])   . Hypertension     Allergies Allergies  Allergen Reactions  . Allegra [Fexofenadine] Anaphylaxis  . Carvedilol Anaphylaxis  . Sulfamethoxazole-Trimethoprim Anaphylaxis  . Sulfanilamide Anaphylaxis  . Adhesive [Tape]     Causes Blisters.  . Ceftriaxone Diarrhea  . Cephalexin Hives  . Clindamycin/Lincomycin Hives  . Clopidogrel   . Ezetimibe Nausea Only  . Haloperidol And Related Other (See Comments)    Confusion  . Hydrocodone Hives  . Insulin Degludec      Heart Flutters  . Insulin Glargine     Headache  . Isosorbide Mononitrate [Isosorbide Dinitrate Er]     Excessive sleep  . Moxifloxacin   . Other     Phenylpropanolamin-Hydrocodone  . Prasugrel     Bleeding  . Ramipril   . Rosuvastatin Hives    Causes her heart to race and pounding in the chest.  . Tylenol [Acetaminophen]   . Atorvastatin Calcium Palpitations  . Minocycline Rash    IV Location/Drains/Wounds Patient Lines/Drains/Airways Status   Active Line/Drains/Airways    Name:   Placement date:   Placement time:   Site:   Days:   Peripheral IV 09/18/17 Right Forearm   09/18/17    -    Forearm   less than 1          Labs/Imaging Results for orders placed or performed during the hospital encounter of 09/17/17 (from the past 48 hour(s))  Comprehensive metabolic panel     Status: Abnormal   Collection Time: 09/17/17  9:26 PM  Result Value Ref Range   Sodium 138 135 - 145 mmol/L   Potassium 4.8 3.5 - 5.1 mmol/L   Chloride 97 (L) 101 - 111 mmol/L   CO2 26 22 - 32 mmol/L   Glucose, Bld 128 (H) 65 - 99 mg/dL   BUN 20 6 - 20 mg/dL   Creatinine, Ser 1.48 (H) 0.44 - 1.00 mg/dL   Calcium 8.3 (L) 8.9 - 10.3 mg/dL   Total Protein 5.9 (L)  6.5 - 8.1 g/dL   Albumin 2.4 (L) 3.5 - 5.0 g/dL   AST 133 (H) 15 - 41 U/L   ALT 42 14 - 54 U/L   Alkaline Phosphatase 151 (H) 38 - 126 U/L   Total Bilirubin 1.9 (H) 0.3 - 1.2 mg/dL   GFR calc non Af Amer 35 (L) >60 mL/min   GFR calc Af Amer 41 (L) >60 mL/min    Comment: (NOTE) The eGFR has been calculated using the CKD EPI equation. This calculation has not been validated in all clinical situations. eGFR's persistently <60 mL/min signify possible Chronic Kidney Disease.    Anion gap 15 5 - 15    Comment: Performed at [facility], [place] [address] [place], [place] [address]  CBC WITH DIFFERENTIAL     Status: Abnormal   Collection Time: 09/17/17  9:26 PM  Result Value Ref Range   WBC 10.4 4.0 - 10.5 K/uL   RBC 4.17  3.87 - 5.11 MIL/uL   Hemoglobin 11.5 (L) 12.0 - 15.0 g/dL   HCT 35.1 (L) 36.0 - 46.0 %   MCV 84.2 78.0 - 100.0 fL   MCH 27.6 26.0 - 34.0 pg   MCHC 32.8 30.0 - 36.0 g/dL   RDW 19.5 (H) 11.5 - 15.5 %   Platelets 245 150 - 400 K/uL   Neutrophils Relative % 77 %   Neutro Abs 8.0 (H) 1.7 - 7.7 K/uL   Lymphocytes Relative 17 %   Lymphs Abs 1.8 0.7 - 4.0 K/uL   Monocytes Relative 6 %   Monocytes Absolute 0.6 0.1 - 1.0 K/uL   Eosinophils Relative 0 %   Eosinophils Absolute 0.0 0.0 - 0.7 K/uL   Basophils Relative 0 %   Basophils Absolute 0.0 0.0 - 0.1 K/uL    Comment: Performed at [facility], [place] [address], [place], [place] [address]  Blood Culture (routine x 2)     Status: None (Preliminary result)   Collection Time: 09/17/17  9:26 PM  Result Value Ref Range   Specimen Description      BLOOD RIGHT FOREARM Performed at [facility] [address] [place], [place] [address]    Special Requests      BOTTLES DRAWN AEROBIC AND ANAEROBIC Blood Culture adequate volume Performed at [facility] [address], [place], [place] [address]    Culture      NO GROWTH < 24 HOURS Performed at [facility] [address], [place], [place] [address]    Report Status PENDING   Blood Culture (routine x 2)     Status: None (Preliminary result)   Collection Time: 09/17/17  9:26 PM  Result Value Ref Range   Specimen Description      BLOOD RIGHT ARM Performed at [facility] [address] [place], [place] [address]    Special Requests      BOTTLES DRAWN AEROBIC AND ANAEROBIC Blood Culture adequate volume Performed at [facility] [address], [place], [place] [address]    Culture      NO GROWTH < 24 HOURS Performed at [facility] [address], [place],  [address]    Report Status PENDING   Urinalysis, Routine w reflex microscopic     Status: Abnormal   Collection  Time: 09/17/17  9:26 PM  Result Value Ref Range   Color, Urine AMBER (A) YELLOW    Comment: BIOCHEMICALS MAY BE  AFFECTED BY COLOR   APPearance CLOUDY (A) CLEAR   Specific Gravity, Urine 1.019 1.005 - 1.030   pH 5.0 5.0 - 8.0   Glucose, UA NEGATIVE NEGATIVE mg/dL   Hgb urine dipstick SMALL (A) NEGATIVE   Bilirubin Urine NEGATIVE NEGATIVE   Ketones, ur 5 (A) NEGATIVE mg/dL   Protein, ur 30 (A) NEGATIVE mg/dL   Nitrite NEGATIVE NEGATIVE   Leukocytes, UA LARGE (A) NEGATIVE   RBC / HPF 0-5 0 - 5 RBC/hpf   WBC, UA TOO NUMEROUS TO COUNT 0 - 5 WBC/hpf   Bacteria, UA FEW (A) NONE SEEN   Squamous Epithelial / LPF 6-30 (A) NONE SEEN   WBC Clumps PRESENT    Mucus PRESENT    Hyaline Casts, UA PRESENT    Non Squamous Epithelial 0-5 (A) NONE SEEN    Comment: Performed at [facility], [place] [address] [place], [place] [address]  I-Stat CG4 Lactic Acid, ED  (not at  [facility])     Status: Abnormal   Collection Time: 09/17/17  9:30 PM  Result Value Ref Range   Lactic Acid, Venous 2.71 (HH) 0.5 - 1.9 mmol/L   Comment NOTIFIED PHYSICIAN   I-Stat CG4 Lactic Acid, ED  (not at  [facility])     Status: Abnormal   Collection Time: 09/17/17 10:45 PM  Result Value Ref Range   Lactic Acid, Venous 2.20 (HH) 0.5 - 1.9 mmol/L   Comment NOTIFIED PHYSICIAN   CBG monitoring, ED     Status: Abnormal   Collection Time: 09/18/17  4:35 AM  Result Value Ref Range   Glucose-Capillary 142 (H) 65 - 99 mg/dL  CBG monitoring, ED     Status: None   Collection Time: 09/18/17  4:50 AM  Result Value Ref Range   Glucose-Capillary 85 65 - 99 mg/dL   Comment 1 Notify RN    Comment 2 Document in Chart   CBC with Differential     Status: Abnormal   Collection Time: 09/18/17  8:57 AM  Result Value Ref Range   WBC 8.2 4.0 - 10.5 K/uL   RBC 3.97 3.87 - 5.11 MIL/uL   Hemoglobin 10.8 (L) 12.0 - 15.0 g/dL   HCT 34.1 (L) 36.0 - 46.0 %   MCV 85.9 78.0 - 100.0 fL   MCH 27.2 26.0 - 34.0 pg   MCHC 31.7 30.0 - 36.0 g/dL    RDW 19.6 (H) 11.5 - 15.5 %   Platelets 212 150 - 400 K/uL   Neutrophils Relative % 68 %   Neutro Abs 5.6 1.7 - 7.7 K/uL   Lymphocytes Relative 22 %   Lymphs Abs 1.8 0.7 - 4.0 K/uL   Monocytes Relative 8 %   Monocytes Absolute 0.7 0.1 - 1.0 K/uL   Eosinophils Relative 1 %   Eosinophils Absolute 0.1 0.0 - 0.7 K/uL   Basophils Relative 1 %   Basophils Absolute 0.0 0.0 - 0.1 K/uL    Comment: Performed at [facility], [place] [address], [place],  [address]  Comprehensive metabolic panel     Status: Abnormal   Collection Time: 09/18/17  8:57 AM  Result Value Ref Range   Sodium 137 135 - 145 mmol/L   Potassium 3.3 (L) 3.5 - 5.1 mmol/L    Comment: DELTA CHECK NOTED   Chloride 100 (L) 101 - 111 mmol/L   CO2 23 22 - 32 mmol/L   Glucose, Bld 144 (H) 65 - 99 mg/dL   BUN 20 6 -  20 mg/dL   Creatinine, Ser 1.34 (H) 0.44 - 1.00 mg/dL   Calcium 8.2 (L) 8.9 - 10.3 mg/dL   Total Protein 5.9 (L) 6.5 - 8.1 g/dL   Albumin 2.3 (L) 3.5 - 5.0 g/dL   AST 133 (H) 15 - 41 U/L   ALT 42 14 - 54 U/L   Alkaline Phosphatase 146 (H) 38 - 126 U/L   Total Bilirubin 1.5 (H) 0.3 - 1.2 mg/dL   GFR calc non Af Amer 39 (L) >60 mL/min   GFR calc Af Amer 46 (L) >60 mL/min    Comment: (NOTE) The eGFR has been calculated using the CKD EPI equation. This calculation has not been validated in all clinical situations. eGFR's persistently <60 mL/min signify possible Chronic Kidney Disease.    Anion gap 14 5 - 15    Comment: Performed at [facility], [place] [address] [place], [place] [address]  Lactic acid, plasma     Status: None   Collection Time: 09/18/17  8:57 AM  Result Value Ref Range   Lactic Acid, Venous 1.8 0.5 - 1.9 mmol/L    Comment: Performed at [facility], [place] [address], [place], [place] [address]  Procalcitonin     Status: None   Collection Time: 09/18/17  8:57 AM  Result Value Ref Range   Procalcitonin 0.13 ng/mL    Comment:         Interpretation: PCT (Procalcitonin) <= 0.5 ng/mL: Systemic infection (sepsis) is not likely. Local bacterial infection is possible. (NOTE)       Sepsis PCT Algorithm           Lower Respiratory Tract                                      Infection PCT Algorithm    ----------------------------     ----------------------------         PCT < 0.25 ng/mL                PCT < 0.10 ng/mL         Strongly encourage             Strongly discourage   discontinuation of antibiotics    initiation of antibiotics    ----------------------------     -----------------------------       PCT 0.25 - 0.50 ng/mL            PCT 0.10 - 0.25 ng/mL               OR       >80% decrease in PCT            Discourage initiation of                                            antibiotics      Encourage discontinuation           of antibiotics    ----------------------------     -----------------------------         PCT >= 0.50 ng/mL              PCT 0.26 - 0.50 ng/mL               AND        <  80% decrease in PCT             Encourage initiation of                                             antibiotics       Encourage continuation           of antibiotics    ----------------------------     -----------------------------        PCT >= 0.50 ng/mL                  PCT > 0.50 ng/mL               AND         increase in PCT                  Strongly encourage                                      initiation of antibiotics    Strongly encourage escalation           of antibiotics                                     -----------------------------                                           PCT <= 0.25 ng/mL                                                 OR                                        > 80% decrease in PCT                                     Discontinue / Do not initiate                                             antibiotics Performed at [facility] [address], [place], [place]  [address]   Magnesium     Status: None   Collection Time: 09/18/17  8:57 AM  Result Value Ref Range   Magnesium 1.8 1.7 - 2.4 mg/dL    Comment: Performed at [facility], [place] [address] [place], [place] [address]  TSH     Status: Abnormal   Collection Time: 09/18/17  8:57 AM  Result Value Ref Range   TSH 5.117 (H) 0.350 - 4.500 uIU/mL    Comment: Performed by a 3rd Generation assay with a functional sensitivity of <=0.01 uIU/mL. Performed at  [facility], [place] [address] [place], [place] [address]   Ammonia     Status: None   Collection Time: 09/18/17  8:57 AM  Result Value Ref Range   Ammonia 24 9 - 35 umol/L    Comment: Performed at [facility], [place] [address] [place], [place] [address]  Vitamin B12     Status: Abnormal   Collection Time: 09/18/17  8:57 AM  Result Value Ref Range   Vitamin B-12 1,410 (H) 180 - 914 pg/mL    Comment: (NOTE) This assay is not validated for testing neonatal or myeloproliferative syndrome specimens for Vitamin B12 levels. Performed at [facility], [place] [address], [place], [place] [address]   Reticulocytes     Status: Abnormal   Collection Time: 09/18/17  8:57 AM  Result Value Ref Range   Retic Ct Pct 3.5 (H) 0.4 - 3.1 %   RBC. 3.95 3.87 - 5.11 MIL/uL   Retic Count, Absolute 138.3 19.0 - 186.0 K/uL    Comment: Performed at [facility], [place] [address] [place], [place] [address]  Iron and TIBC     Status: Abnormal   Collection Time: 09/18/17  8:57 AM  Result Value Ref Range   Iron 26 (L) 28 - 170 ug/dL   TIBC 213 (L) 250 - 450 ug/dL   Saturation Ratios 12 10.4 - 31.8 %   UIBC 187 ug/dL    Comment: Performed at [facility], [place] [address], [place], [place] [address]  Ferritin     Status: None   Collection Time: 09/18/17  8:57 AM  Result Value Ref Range   Ferritin 224 11 - 307 ng/mL    Comment: Performed at [facility], [place] [address]  [place], [place] [address]  Folate     Status: None   Collection Time: 09/18/17  8:57 AM  Result Value Ref Range   Folate 25.8 >5.9 ng/mL    Comment: Performed at [facility] [address], [place],  [address]  CBG monitoring, ED     Status: Abnormal   Collection Time: 09/18/17 10:04 AM  Result Value Ref Range   Glucose-Capillary 138 (H) 65 - 99 mg/dL  CBG monitoring, ED     Status: Abnormal   Collection Time: 09/18/17 12:05 PM  Result Value Ref Range   Glucose-Capillary 121 (H) 65 - 99 mg/dL  CBG monitoring, ED     Status: Abnormal   Collection Time: 09/18/17  5:03 PM  Result Value Ref Range   Glucose-Capillary 164 (H) 65 - 99 mg/dL  CBG monitoring, ED     Status: Abnormal   Collection Time: 09/18/17  7:36 PM  Result Value Ref Range   Glucose-Capillary 146 (H) 65 - 99 mg/dL   Ct Abdomen Pelvis Wo Contrast  Result Date: 09/18/2017 CLINICAL DATA:  Abdomen distension with nausea and vomiting EXAM: CT ABDOMEN AND PELVIS WITHOUT CONTRAST TECHNIQUE: Multidetector CT imaging of the abdomen and pelvis was performed following the standard protocol without IV contrast. COMPARISON:  Ultrasound 09/17/2017, report 03/17/2013 FINDINGS: Lower chest: Small left greater than right pleural effusion. Cardiomegaly with coronary vascular calcification. Small moderate hiatal hernia with surgical clips near the GE junction. Hepatobiliary: No focal liver abnormality is seen. Status post cholecystectomy. No biliary dilatation. Pancreas: Unremarkable. No pancreatic ductal dilatation or surrounding inflammatory changes. Spleen: Normal in size without focal abnormality. Adrenals/Urinary Tract: Adrenal glands are within normal limits. Kidneys show no hydronephrosis.  The bladder is under distended Stomach/Bowel: Stomach is within normal limits. Appendix appears normal. No evidence of bowel wall thickening, distention, or inflammatory changes. Vascular/Lymphatic: Moderate aortic atherosclerosis. No aneurysmal  dilatation. No significantly enlarged lymph nodes. Reproductive: Status post hysterectomy. No adnexal masses. Other: Negative for free air or significant free fluid. Ventral hernia containing mesenteric fat within the upper abdominal wall. Supraumbilical hernia containing fat with abdominal wall laxity and protrusion of small bowel loops and mesenteric fat anteriorly. Diffuse subcutaneous edema with fluid in the left greater than right flank regions. Musculoskeletal: Degenerative changes. No acute or suspicious lesion. IMPRESSION: 1. Negative for a bowel obstruction or bowel wall thickening. 2. Small left greater than right pleural effusions with bibasilar atelectasis or minimal infiltrate. 3. Fat containing ventral hernias. 4. Diffuse subcutaneous edema with fluid in the left greater than right flank. Electronically Signed   By: Donavan Foil M.D.   On: 09/18/2017 03:02   Ct Head Wo Contrast  Result Date: 09/18/2017 CLINICAL DATA:  Altered level of consciousness, delirium with agitation EXAM: CT HEAD WITHOUT CONTRAST TECHNIQUE: Contiguous axial images were obtained from the base of the skull through the vertex without intravenous contrast. COMPARISON:  08/03/2017 head CT, MRI 06/07/2017 FINDINGS: Brain: No acute territorial infarction, hemorrhage or intracranial mass is visualized. Atrophy and mild small vessel ischemic changes of the white matter. Stable ventricle size. Vascular: No hyperdense vessels.  Carotid vascular calcification Skull: Normal. Negative for fracture or focal lesion. Sinuses/Orbits: Mild mucosal thickening in the maxillary and ethmoid sinuses. No acute orbital abnormality. Other: None IMPRESSION: 1. No CT evidence for acute intracranial abnormality. 2. Atrophy and small vessel ischemic changes of the white matter. Electronically Signed   By: Donavan Foil M.D.   On: 09/18/2017 02:52   Dg Chest Port 1 View  Result Date: 09/17/2017 CLINICAL DATA:  Altered mental status.  Sepsis. EXAM:  PORTABLE CHEST 1 VIEW COMPARISON:  08/08/2017 FINDINGS: Patient rotated left. Artifact projects over the medial upper right lung. Cardiomegaly accentuated by AP portable technique. Probable small bilateral pleural effusions. No pneumothorax. Persistent bibasilar airspace disease. Low lung volumes with resultant pulmonary interstitial prominence. IMPRESSION: Probable small bilateral pleural effusions. Bibasilar airspace disease could represent persistent or recurrent atelectasis versus infection. Cardiomegaly and low lung volumes.  No overt congestive failure. Electronically Signed   By: Abigail Miyamoto M.D.   On: 09/17/2017 20:43   US Abdomen Limited Ruq  Result Date: 09/17/2017 CLINICAL DATA:  Right upper quadrant pain. EXAM: ULTRASOUND ABDOMEN LIMITED RIGHT UPPER QUADRANT COMPARISON:  None. FINDINGS: Gallbladder: Surgically absent. Common bile duct: Diameter: 6 mm. Liver: Technically limited exam due to patient motion and inability to hold position. No focal lesion identified. Grossly within normal limits in parenchymal echogenicity. Portal vein is patent on color Doppler imaging with normal direction of blood flow towards the liver. IMPRESSION: 1. Postcholecystectomy without biliary dilatation. 2. No gross focal hepatic abnormality demonstrated sonographically. Electronically Signed   By: Jeb Levering M.D.   On: 09/17/2017 23:44    Pending Labs Unresulted Labs (From admission, onward)   Start     Ordered   09/19/17 0500  CBC  Tomorrow morning,   R     09/18/17 1032   09/19/17 0500  Comprehensive metabolic panel  Tomorrow morning,   R     09/18/17 1032   09/19/17 0500  Magnesium  Tomorrow morning,   R     09/18/17 1032   09/17/17 2022  Urine culture  STAT,   STAT  09/17/17 2021      Vitals/Pain Today's Vitals   09/18/17 1730 09/18/17 1800 09/18/17 1830 09/18/17 1906  BP: 108/81 107/60 118/71 (!) 95/57  Pulse: (!) 101 100 98 98  Resp: '19 15 16 ' (!) 21  Temp:    98.3 F (36.8 C)   TempSrc:      SpO2: 95% 96% 93% 93%  Weight:      Height:        Isolation Precautions No active isolations  Medications Medications  ondansetron (ZOFRAN) tablet 4 mg (not administered)    Or  ondansetron (ZOFRAN) injection 4 mg (not administered)  0.9 %  sodium chloride infusion ( Intravenous New Bag/Given 09/18/17 0456)  hydrALAZINE (APRESOLINE) injection 10 mg (not administered)  insulin aspart (novoLOG) injection 0-9 Units (2 Units Subcutaneous Given 09/18/17 1709)  piperacillin-tazobactam (ZOSYN) IVPB 3.375 g (0 g Intravenous Stopped 09/18/17 1646)  vancomycin (VANCOCIN) IVPB 1000 mg/200 mL premix (0 mg Intravenous Stopped 09/18/17 1346)  sodium chloride 0.9 % bolus 1,000 mL (0 mLs Intravenous Stopped 09/17/17 2223)  piperacillin-tazobactam (ZOSYN) IVPB 3.375 g (0 g Intravenous Stopped 09/17/17 2146)  vancomycin (VANCOCIN) IVPB 1000 mg/200 mL premix (0 mg Intravenous Stopped 09/17/17 2309)  sodium chloride 0.9 % bolus 1,000 mL (0 mLs Intravenous Stopped 09/17/17 2230)  haloperidol lactate (HALDOL) injection 2 mg (2 mg Intravenous Given 09/17/17 2131)  sodium chloride 0.9 % bolus 500 mL (0 mLs Intravenous Stopped 09/18/17 0030)  OLANZapine (ZYPREXA) injection 5 mg (5 mg Intramuscular Given 09/18/17 0135)  potassium chloride (KLOR-CON) packet 40 mEq (40 mEq Oral Given 09/18/17 1711)  magnesium sulfate IVPB 4 g 100 mL (0 g Intravenous Stopped 09/18/17 1454)    Mobility non-ambulatory

## 2017-09-19 DIAGNOSIS — L8990 Pressure ulcer of unspecified site, unspecified stage: Secondary | ICD-10-CM

## 2017-09-19 DIAGNOSIS — N3000 Acute cystitis without hematuria: Secondary | ICD-10-CM

## 2017-09-19 LAB — BLOOD CULTURE ID PANEL (REFLEXED)
ACINETOBACTER BAUMANNII: NOT DETECTED
CANDIDA ALBICANS: NOT DETECTED
CANDIDA GLABRATA: NOT DETECTED
CANDIDA TROPICALIS: NOT DETECTED
Candida krusei: NOT DETECTED
Candida parapsilosis: NOT DETECTED
ENTEROBACTER CLOACAE COMPLEX: NOT DETECTED
ENTEROBACTERIACEAE SPECIES: NOT DETECTED
ENTEROCOCCUS SPECIES: NOT DETECTED
Escherichia coli: NOT DETECTED
Haemophilus influenzae: NOT DETECTED
Klebsiella oxytoca: NOT DETECTED
Klebsiella pneumoniae: NOT DETECTED
LISTERIA MONOCYTOGENES: NOT DETECTED
METHICILLIN RESISTANCE: DETECTED — AB
NEISSERIA MENINGITIDIS: NOT DETECTED
Proteus species: NOT DETECTED
Pseudomonas aeruginosa: NOT DETECTED
STAPHYLOCOCCUS SPECIES: DETECTED — AB
STREPTOCOCCUS PYOGENES: NOT DETECTED
STREPTOCOCCUS SPECIES: NOT DETECTED
Serratia marcescens: NOT DETECTED
Staphylococcus aureus (BCID): NOT DETECTED
Streptococcus agalactiae: NOT DETECTED
Streptococcus pneumoniae: NOT DETECTED

## 2017-09-19 LAB — COMPREHENSIVE METABOLIC PANEL
ALT: 35 U/L (ref 14–54)
AST: 81 U/L — AB (ref 15–41)
Albumin: 2.4 g/dL — ABNORMAL LOW (ref 3.5–5.0)
Alkaline Phosphatase: 142 U/L — ABNORMAL HIGH (ref 38–126)
Anion gap: 11 (ref 5–15)
BUN: 17 mg/dL (ref 6–20)
CHLORIDE: 102 mmol/L (ref 101–111)
CO2: 26 mmol/L (ref 22–32)
CREATININE: 1.05 mg/dL — AB (ref 0.44–1.00)
Calcium: 8.3 mg/dL — ABNORMAL LOW (ref 8.9–10.3)
GFR calc non Af Amer: 53 mL/min — ABNORMAL LOW (ref >60)
Glucose, Bld: 108 mg/dL — ABNORMAL HIGH (ref 65–99)
Potassium: 3 mmol/L — ABNORMAL LOW (ref 3.5–5.1)
Sodium: 139 mmol/L (ref 135–145)
Total Bilirubin: 1.6 mg/dL — ABNORMAL HIGH (ref 0.3–1.2)
Total Protein: 6 g/dL — ABNORMAL LOW (ref 6.5–8.1)

## 2017-09-19 LAB — GLUCOSE, CAPILLARY
GLUCOSE-CAPILLARY: 111 mg/dL — AB (ref 65–99)
GLUCOSE-CAPILLARY: 136 mg/dL — AB (ref 65–99)
GLUCOSE-CAPILLARY: 186 mg/dL — AB (ref 65–99)
GLUCOSE-CAPILLARY: 95 mg/dL (ref 65–99)
Glucose-Capillary: 88 mg/dL (ref 65–99)
Glucose-Capillary: 171 mg/dL — ABNORMAL HIGH (ref 65–99)

## 2017-09-19 LAB — CBC
HCT: 34.4 % — ABNORMAL LOW (ref 36.0–46.0)
Hemoglobin: 11.1 g/dL — ABNORMAL LOW (ref 12.0–15.0)
MCH: 26.9 pg (ref 26.0–34.0)
MCHC: 32.3 g/dL (ref 30.0–36.0)
MCV: 83.3 fL (ref 78.0–100.0)
PLATELETS: 211 10*3/uL (ref 150–400)
RBC: 4.13 MIL/uL (ref 3.87–5.11)
RDW: 20 % — ABNORMAL HIGH (ref 11.5–15.5)
WBC: 8.2 10*3/uL (ref 4.0–10.5)

## 2017-09-19 LAB — URINE CULTURE

## 2017-09-19 LAB — MAGNESIUM: Magnesium: 2.5 mg/dL — ABNORMAL HIGH (ref 1.7–2.4)

## 2017-09-19 MED ORDER — APIXABAN 5 MG PO TABS
5.0000 mg | ORAL_TABLET | Freq: Two times a day (BID) | ORAL | Status: DC
  Administered 2017-09-19 – 2017-09-23 (×8): 5 mg via ORAL
  Filled 2017-09-19 (×9): qty 1

## 2017-09-19 MED ORDER — SODIUM CHLORIDE 0.9 % IV SOLN
INTRAVENOUS | Status: DC
  Administered 2017-09-20 (×2): via INTRAVENOUS
  Administered 2017-09-21: 10 mL/h via INTRAVENOUS

## 2017-09-19 MED ORDER — QUETIAPINE 12.5 MG HALF TABLET
12.5000 mg | ORAL_TABLET | Freq: Three times a day (TID) | ORAL | Status: DC
  Administered 2017-09-19 – 2017-09-20 (×4): 12.5 mg via ORAL
  Filled 2017-09-19 (×6): qty 1

## 2017-09-19 MED ORDER — INSULIN ASPART 100 UNIT/ML ~~LOC~~ SOLN
0.0000 [IU] | Freq: Three times a day (TID) | SUBCUTANEOUS | Status: DC
  Administered 2017-09-20: 1 [IU] via SUBCUTANEOUS
  Administered 2017-09-20: 2 [IU] via SUBCUTANEOUS
  Administered 2017-09-20: 1 [IU] via SUBCUTANEOUS
  Administered 2017-09-21: 2 [IU] via SUBCUTANEOUS
  Administered 2017-09-21: 1 [IU] via SUBCUTANEOUS
  Administered 2017-09-21 (×2): 2 [IU] via SUBCUTANEOUS
  Administered 2017-09-22: 1 [IU] via SUBCUTANEOUS
  Administered 2017-09-23 (×2): 2 [IU] via SUBCUTANEOUS

## 2017-09-19 MED ORDER — SODIUM CHLORIDE 0.9 % IV SOLN: INTRAVENOUS | Status: DC

## 2017-09-19 MED ORDER — POTASSIUM CHLORIDE 20 MEQ/15ML (10%) PO SOLN
40.0000 meq | ORAL | Status: AC
  Administered 2017-09-19 (×2): 40 meq via ORAL
  Filled 2017-09-19 (×2): qty 30

## 2017-09-19 MED ORDER — POTASSIUM CHLORIDE CRYS ER 20 MEQ PO TBCR: 40.0000 meq | EXTENDED_RELEASE_TABLET | ORAL | Status: DC

## 2017-09-19 NOTE — Progress Notes (Signed)
PHARMACY - PHYSICIAN COMMUNICATION CRITICAL VALUE ALERT - BLOOD CULTURE IDENTIFICATION (BCID)  Stacy ShorterJanice Dorsey is an 72 y.o. female who [facility] with a chief complaint of altered mental status.   Assessment:  Likely UTI (include suspected source if known)  Name of physician (or Provider) Contacted: Kirtland BouchardK Schorr, NP  Current antibiotics: Vancomycin and Zosyn  Changes to prescribed antibiotics recommended:  1 of 4 bottles, Staph species > MecA+. Likely contaminant. Consider narrowing abx.   Results for orders placed or performed during the hospital encounter of 09/17/17  Blood Culture ID Panel ([place]) (Collected: 09/17/2017  9:26 PM)  Result Value Ref Range   Enterococcus species NOT DETECTED NOT DETECTED   Listeria monocytogenes NOT DETECTED NOT DETECTED   Staphylococcus species DETECTED (A) NOT DETECTED   Staphylococcus aureus NOT DETECTED NOT DETECTED   Methicillin resistance DETECTED (A) NOT DETECTED   Streptococcus species NOT DETECTED NOT DETECTED   Streptococcus agalactiae NOT DETECTED NOT DETECTED   Streptococcus pneumoniae NOT DETECTED NOT DETECTED   Streptococcus pyogenes NOT DETECTED NOT DETECTED   Acinetobacter baumannii NOT DETECTED NOT DETECTED   Enterobacteriaceae species NOT DETECTED NOT DETECTED   Enterobacter cloacae complex NOT DETECTED NOT DETECTED   Escherichia coli NOT DETECTED NOT DETECTED   Klebsiella oxytoca NOT DETECTED NOT DETECTED   Klebsiella pneumoniae NOT DETECTED NOT DETECTED   Proteus species NOT DETECTED NOT DETECTED   Serratia marcescens NOT DETECTED NOT DETECTED   Haemophilus influenzae NOT DETECTED NOT DETECTED   Neisseria meningitidis NOT DETECTED NOT DETECTED   Pseudomonas aeruginosa NOT DETECTED NOT DETECTED   Candida albicans NOT DETECTED NOT DETECTED   Candida glabrata NOT DETECTED NOT DETECTED   Candida krusei NOT DETECTED NOT DETECTED   Candida parapsilosis NOT DETECTED NOT DETECTED   Candida tropicalis NOT  DETECTED NOT DETECTED    Lorenza EvangelistGreen, Bray Vickerman R 09/19/2017  2:43 AM

## 2017-09-19 NOTE — Progress Notes (Signed)
PT Cancellation Note  Patient Details Name: Stacy ShorterJanice Dorsey MRN: [id number] DOB: [birth year]   Cancelled Treatment:    Reason Eval/Treat Not Completed: Patient at procedure or test/unavailable  Speech Pathology going in for evaluation.    Rada HayHill, Dannae Kato Elizabeth 09/19/2017, 4:07 PM  Blanchard KelchKaren Esker Dever PT [phone]

## 2017-09-19 NOTE — Progress Notes (Signed)
PROGRESS NOTE    Stacy Dorsey  NOT:[id number] DOB: Nov 29, 1945 DOA: 09/17/2017 PCP: Patient, No Pcp Per    Brief Narrative:  Stacy Dorsey is a 72 y.o. female with history of cardiomyopathy, diabetes mellitus, hypertension was recently discharged from rehab 4 days ago after being treated for left femur fracture was brought to the ER because of increasing confusion.  Since her discharge from the rehab patient had become more delirious not eating well with fever and chills.  Patient has had recurrent episodes of delirium during hospitalization and also patient has urinary tract infection as per the report.    ED Course: In the ER patient is tachycardic fever of 101.  Patient's chest x-ray was unremarkable.  Lactate levels were elevated and had leukocytosis.  UA shows features consistent with UTI.  Since LFTs were elevated sonogram of the right upper quadrant was done which was unremarkable.  CT head and CT abdomen are pending.  Patient was started on empiric antibiotics and fluids and admitted for acute encephalopathy with edema and possible developing sepsis.  Flu panel is negative.     Assessment & Plan:   Principal Problem:   Acute encephalopathy Active Problems:   Sepsis ([place])   Acute lower UTI   CAD (coronary artery disease)   Cardiomyopathy ([place])   Essential hypertension   Pressure injury of skin  #1 acute encephalopathy Questionable etiology.  May be secondary to sepsis secondary to probable acute UTI.  Blood cultures have been ordered in 1 out of 4 bottles with staph species which are coagulase-negative and felt likely to be a contaminant.  Patient when awake alert to self only.  Patient with underlying dementia and unknown baseline.  Urine cultures with multiple species present.  Patient currently afebrile.  Labs pending.  Continue IV fluids, empiric IV Zosyn.  Will discontinue IV vancomycin.  2.  Sepsis likely secondary to probable UTI Questionable etiology.  Concern that sepsis  likely secondary to UTI.  Blood cultures with 1/4 bottles with coagulase-negative staph felt likely to be a contaminant.  Urine cultures with multiple species.  Urinalysis however had large leukocytes too numerous to count WBCs.  Lactic acid levels trended down.  Patient afebrile.  CBC MD met pending for this morning.  Continue empiric IV Zosyn.  Will discontinue IV vancomycin for now and follow.  3.  Borderline blood pressure Patient with systolic blood pressures in the 90s.  Concern for sepsis.  Patient has been pancultured.  Urine cultures with multiple species.  Blood cultures pending with 1/4 bottles with coagulase-negative staph.  Placed on IV fluids.  Supportive care.  Continue empiric IV Zosyn.  Follow.  4.  Hypertension Continue to hold antihypertensive medication secondary to problem #3.  5.  Acute renal failure Felt secondary to prerenal azotemia secondary to poor oral intake.  CT abdomen and pelvis negative for hydronephrosis.  Be made pending.  Continue gentle hydration.  6.  Normocytic anemia/iron deficiency anemia CBC pending for this morning.  No overt bleeding noted.  Follow H&H.  7.  History of coronary artery disease status post multiple stents Currently stable.  Patient denies any chest pain.  Patient's cardiology office visit 07/24/2017 patient noted to be on aspirin and Plavix at that time.  On patient's home med rec Eliquis is noted questionable etiology.  Follow for now.  8.  Elevated LFTs Questionable etiology.  May be secondary to developing sepsis.  Labs pending for this morning.  Right upper quadrant ultrasound negative for acute cholecystitis.  CT abdomen and pelvis negative for bowel obstruction or bowel wall thickening.  Small left greater than right pleural effusions with bibasilar atelectasis or minimal infiltrate.  Fat-containing ventral hernias.  Diffuse subcutaneous edema with fluid in the left greater than right flank.  Hold statin.  Follow.  9.   Hypokalemia/hypomagnesemia Replete.  10.  Dementia Patient with noted agitation overnight.  Patient currently in mittens.  Continue Zyprexa. Patient was on seroquel 12.95m PO tid. will resume patient's Seroquel due to agitation noted over the past 24 hours.  Hold for sedation.  11.  Gastroesophageal reflux disease Continue PPI.  #12 ??  Chronic anticoagulation Patient noted on admission to be on Eliquis.  Will need to find out from family why patient is on anticoagulation.  Seems patient's Eliquis may have been started by orthopedics as it is noted on the a note from 09/10/2017.  13.  Diabetes mellitus Continue sliding scale insulin.  14.  Left supracondylar femur fracture [facility]  Per orthopedics note patient okay for full weightbearing.  PT/OT.  Patient with no overt bleeding and as such we will resume Eliquis for possible DVT prophylaxis.  DVT prophylaxis: SCDs Code Status: Full Family Communication: Updated patient.  No family at bedside. Disposition Plan: Pending hospital course.  Home with home health versus skilled nursing facility once medically stable, improvement with sepsis, back to baseline.   Consultants:   None  Procedures:   CT head 09/18/2017  CT abdomen and pelvis 09/18/2017  Right upper quadrant ultrasound 09/17/2017  Chest x-ray 09/17/2017  Antimicrobials:   IV vancomycin 09/18/2017  IV Zosyn 09/18/2017   Subjective: Patient sleeping deeply.  Mouth breather.  Per nursing patient agitated overnight and had to be placed in mittens.  Patient just fell asleep.  Per nurse patient alert to self and when awake able to tolerate oral intake.  Objective: Vitals:   09/18/17 2200 09/19/17 0000 09/19/17 0400 09/19/17 0800  BP: 108/71 110/68 105/70 95/61  Pulse: (!) 102 (!) 105 94 (!) 107  Resp: '19 16  15  ' Temp:   98.8 F (37.1 C) 98.4 F (36.9 C)  TempSrc:    Oral  SpO2: 100%  (!) 88% 96% 98%  Weight:      Height:        Intake/Output Summary (Last 24 hours) at 09/19/2017 1022 Last data filed at 09/19/2017 0809 Gross per 24 hour  Intake 910 ml  Output 150 ml  Net 760 ml   Filed Weights   09/17/17 2225 09/18/17 2007  Weight: 78 kg (172 lb) 86.1 kg (189 lb 13.1 oz)    Examination:  General exam: Sleeping heavily. Respiratory system: Clear to auscultation anterior lung fields. Respiratory effort normal. Cardiovascular system: S1 & S2 heard, RRR. No JVD, murmurs, rubs, gallops or clicks. No pedal edema. Gastrointestinal system: Abdomen is nondistended, soft and nontender. No organomegaly or masses felt. Normal bowel sounds heard. Central nervous system: Unable to assess as patient sleeping.  Per nursing patient alert to self only.  Moving extremities spontaneously.  Extremities: Symmetric 5 x 5 power. Skin: No rashes, lesions or ulcers Psychiatry: Judgement and insight appear normal. Mood & affect appropriate.     Data Reviewed: I have personally reviewed following labs and imaging studies  CBC: Recent Labs  Lab 09/17/17 2126 09/18/17 0857  WBC 10.4 8.2  NEUTROABS 8.0* 5.6  HGB 11.5* 10.8*  HCT 35.1* 34.1*  MCV  84.2 85.9  PLT 245 628   Basic Metabolic Panel: Recent Labs  Lab 09/17/17 2126 09/18/17 0857  NA 138 137  K 4.8 3.3*  CL 97* 100*  CO2 26 23  GLUCOSE 128* 144*  BUN 20 20  CREATININE 1.48* 1.34*  CALCIUM 8.3* 8.2*  MG  --  1.8   GFR: Estimated Creatinine Clearance: 39.5 mL/min (A) (by C-G formula based on SCr of 1.34 mg/dL (H)). Liver Function Tests: Recent Labs  Lab 09/17/17 2126 09/18/17 0857  AST 133* 133*  ALT 42 42  ALKPHOS 151* 146*  BILITOT 1.9* 1.5*  PROT 5.9* 5.9*  ALBUMIN 2.4* 2.3*   No results for input(s): LIPASE, AMYLASE in the last 168 hours. Recent Labs  Lab 09/18/17 0857  AMMONIA 24   Coagulation Profile: No results for input(s): INR, PROTIME in the last 168 hours. Cardiac Enzymes: No results  for input(s): CKTOTAL, CKMB, CKMBINDEX, TROPONINI in the last 168 hours. BNP (last 3 results) No results for input(s): PROBNP in the last 8760 hours. HbA1C: No results for input(s): HGBA1C in the last 72 hours. CBG: Recent Labs  Lab 09/18/17 1703 09/18/17 1936 09/19/17 0038 09/19/17 0420 09/19/17 0740  GLUCAP 164* 146* 111* 136* 88   Lipid Profile: No results for input(s): CHOL, HDL, LDLCALC, TRIG, CHOLHDL, LDLDIRECT in the last 72 hours. Thyroid Function Tests: Recent Labs    09/18/17 0857  TSH 5.117*   Anemia Panel: Recent Labs    09/18/17 0857  VITAMINB12 1,410*  FOLATE 25.8  FERRITIN 224  TIBC 213*  IRON 26*  RETICCTPCT 3.5*   Sepsis Labs: Recent Labs  Lab 09/17/17 2130 09/17/17 2245 09/18/17 0857  PROCALCITON  --   --  0.13  LATICACIDVEN 2.71* 2.20* 1.8    Recent Results (from the past 240 hour(s))  Blood Culture (routine x 2)     Status: None (Preliminary result)   Collection Time: 09/17/17  9:26 PM  Result Value Ref Range Status   Specimen Description   Final    BLOOD RIGHT FOREARM Performed at [facility] [address] [place], [place] [address]    Special Requests   Final    BOTTLES DRAWN AEROBIC AND ANAEROBIC Blood Culture adequate volume Performed at [facility] [address] [place], [place] [address]    Culture   Final    NO GROWTH < 24 HOURS Performed at [facility] [address], [place], [place] [address]    Report Status PENDING  Incomplete  Blood Culture (routine x 2)     Status: None (Preliminary result)   Collection Time: 09/17/17  9:26 PM  Result Value Ref Range Status   Specimen Description   Final    BLOOD RIGHT ARM Performed at [facility] [address] [place], [place] [address]    Special Requests   Final    BOTTLES DRAWN AEROBIC AND ANAEROBIC Blood Culture adequate volume Performed at [facility] [address]  [place], [place] [address]    Culture  Setup Time   Final    GRAM POSITIVE COCCI AEROBIC BOTTLE ONLY CRITICAL RESULT CALLED TO, READ BACK BY AND VERIFIED WITH: B GREEN PHARMD 09/19/17 0240 JDW Performed at [facility], [address], [place], [place] [address]    Culture [facility] POSITIVE COCCI  Final   Report Status PENDING  Incomplete  Urine culture     Status: Abnormal   Collection Time: 09/17/17  9:26 PM  Result Value Ref Range Status   Specimen Description   Final    URINE, CATHETERIZED Performed at [facility], [place] [address], [place], [place] [address]    Special Requests   Final    NONE Performed at [facility], [place] [address] [place], [place] [address]    Culture MULTIPLE SPECIES PRESENT, SUGGEST RECOLLECTION (A)  Final   Report Status 09/19/2017 FINAL  Final  Blood Culture ID Panel (Reflexed)     Status: Abnormal   Collection Time: 09/17/17  9:26 PM  Result Value Ref Range Status   Enterococcus species NOT DETECTED NOT DETECTED Final   Listeria monocytogenes NOT DETECTED NOT DETECTED Final   Staphylococcus species DETECTED (A) NOT DETECTED Final    Comment: Methicillin (oxacillin) resistant coagulase negative staphylococcus. Possible blood culture contaminant (unless isolated from more than one blood culture draw or clinical case suggests pathogenicity). No antibiotic treatment is indicated for blood  culture contaminants. CRITICAL RESULT CALLED TO, READ BACK BY AND VERIFIED WITH: B GREEN PHARMD 09/19/17 0240 JDW    Staphylococcus aureus NOT DETECTED NOT DETECTED Final   Methicillin resistance DETECTED (A) NOT DETECTED Final    Comment: CRITICAL RESULT CALLED TO, READ BACK BY AND VERIFIED WITH: B GREEN PHARMD 09/19/17 0240 JDW    Streptococcus species NOT DETECTED NOT DETECTED Final   Streptococcus agalactiae NOT DETECTED NOT DETECTED Final   Streptococcus pneumoniae NOT DETECTED NOT DETECTED Final   Streptococcus pyogenes NOT  DETECTED NOT DETECTED Final   Acinetobacter baumannii NOT DETECTED NOT DETECTED Final   Enterobacteriaceae species NOT DETECTED NOT DETECTED Final   Enterobacter cloacae complex NOT DETECTED NOT DETECTED Final   Escherichia coli NOT DETECTED NOT DETECTED Final   Klebsiella oxytoca NOT DETECTED NOT DETECTED Final   Klebsiella pneumoniae NOT DETECTED NOT DETECTED Final   Proteus species NOT DETECTED NOT DETECTED Final   Serratia marcescens NOT DETECTED NOT DETECTED Final   Haemophilus influenzae NOT DETECTED NOT DETECTED Final   Neisseria meningitidis NOT DETECTED NOT DETECTED Final   Pseudomonas aeruginosa NOT DETECTED NOT DETECTED Final   Candida albicans NOT DETECTED NOT DETECTED Final   Candida glabrata NOT DETECTED NOT DETECTED Final   Candida krusei NOT DETECTED NOT DETECTED Final   Candida parapsilosis NOT DETECTED NOT DETECTED Final   Candida tropicalis NOT DETECTED NOT DETECTED Final    Comment: Performed at [facility], [place] [address], [place], [place] [address]  MRSA PCR Screening     Status: Abnormal   Collection Time: 09/18/17  9:08 PM  Result Value Ref Range Status   MRSA by PCR POSITIVE (A) NEGATIVE Final    Comment:        The GeneXpert MRSA Assay (FDA approved for NASAL specimens only), is one component of a comprehensive MRSA colonization surveillance program. It is not intended to diagnose MRSA infection nor to guide or monitor treatment for MRSA infections. RESULT CALLED TO, READ BACK BY AND VERIFIED WITH: Tonie Griffith [id number] @ 2330 BY J SCOTTON Performed at [facility] [address], [place], [place] [address]          Radiology Studies: Ct Abdomen Pelvis Wo Contrast  Result Date: 09/18/2017 CLINICAL DATA:  Abdomen distension with nausea and vomiting EXAM: CT ABDOMEN AND PELVIS WITHOUT CONTRAST TECHNIQUE: Multidetector CT imaging of the abdomen and pelvis was performed following the standard protocol without IV contrast.  COMPARISON:  Ultrasound 09/17/2017, report 03/17/2013 FINDINGS: Lower chest: Small  left greater than right pleural effusion. Cardiomegaly with coronary vascular calcification. Small moderate hiatal hernia with surgical clips near the GE junction. Hepatobiliary: No focal liver abnormality is seen. Status post cholecystectomy. No biliary dilatation. Pancreas: Unremarkable. No pancreatic ductal dilatation or surrounding inflammatory changes. Spleen: Normal in size without focal abnormality. Adrenals/Urinary Tract: Adrenal glands are within normal limits. Kidneys show no hydronephrosis. The bladder is under distended Stomach/Bowel: Stomach is within normal limits. Appendix appears normal. No evidence of bowel wall thickening, distention, or inflammatory changes. Vascular/Lymphatic: Moderate aortic atherosclerosis. No aneurysmal dilatation. No significantly enlarged lymph nodes. Reproductive: Status post hysterectomy. No adnexal masses. Other: Negative for free air or significant free fluid. Ventral hernia containing mesenteric fat within the upper abdominal wall. Supraumbilical hernia containing fat with abdominal wall laxity and protrusion of small bowel loops and mesenteric fat anteriorly. Diffuse subcutaneous edema with fluid in the left greater than right flank regions. Musculoskeletal: Degenerative changes. No acute or suspicious lesion. IMPRESSION: 1. Negative for a bowel obstruction or bowel wall thickening. 2. Small left greater than right pleural effusions with bibasilar atelectasis or minimal infiltrate. 3. Fat containing ventral hernias. 4. Diffuse subcutaneous edema with fluid in the left greater than right flank. Electronically Signed   By: Donavan Foil M.D.   On: 09/18/2017 03:02   Ct Head Wo Contrast  Result Date: 09/18/2017 CLINICAL DATA:  Altered level of consciousness, delirium with agitation EXAM: CT HEAD WITHOUT CONTRAST TECHNIQUE: Contiguous axial images were obtained from the base of the skull  through the vertex without intravenous contrast. COMPARISON:  08/03/2017 head CT, MRI 06/07/2017 FINDINGS: Brain: No acute territorial infarction, hemorrhage or intracranial mass is visualized. Atrophy and mild small vessel ischemic changes of the white matter. Stable ventricle size. Vascular: No hyperdense vessels.  Carotid vascular calcification Skull: Normal. Negative for fracture or focal lesion. Sinuses/Orbits: Mild mucosal thickening in the maxillary and ethmoid sinuses. No acute orbital abnormality. Other: None IMPRESSION: 1. No CT evidence for acute intracranial abnormality. 2. Atrophy and small vessel ischemic changes of the white matter. Electronically Signed   By: Donavan Foil M.D.   On: 09/18/2017 02:52   Dg Chest Port 1 View  Result Date: 09/17/2017 CLINICAL DATA:  Altered mental status.  Sepsis. EXAM: PORTABLE CHEST 1 VIEW COMPARISON:  08/08/2017 FINDINGS: Patient rotated left. Artifact projects over the medial upper right lung. Cardiomegaly accentuated by AP portable technique. Probable small bilateral pleural effusions. No pneumothorax. Persistent bibasilar airspace disease. Low lung volumes with resultant pulmonary interstitial prominence. IMPRESSION: Probable small bilateral pleural effusions. Bibasilar airspace disease could represent persistent or recurrent atelectasis versus infection. Cardiomegaly and low lung volumes.  No overt congestive failure. Electronically Signed   By: Abigail Miyamoto M.D.   On: 09/17/2017 20:43   US Abdomen Limited Ruq  Result Date: 09/17/2017 CLINICAL DATA:  Right upper quadrant pain. EXAM: ULTRASOUND ABDOMEN LIMITED RIGHT UPPER QUADRANT COMPARISON:  None. FINDINGS: Gallbladder: Surgically absent. Common bile duct: Diameter: 6 mm. Liver: Technically limited exam due to patient motion and inability to hold position. No focal lesion identified. Grossly within normal limits in parenchymal echogenicity. Portal vein is patent on color Doppler imaging with normal  direction of blood flow towards the liver. IMPRESSION: 1. Postcholecystectomy without biliary dilatation. 2. No gross focal hepatic abnormality demonstrated sonographically. Electronically Signed   By: Jeb Levering M.D.   On: 09/17/2017 23:44        Scheduled Meds: . Chlorhexidine Gluconate Cloth  6 each Topical Q0600  . dorzolamide  1  drop Both Eyes TID  . insulin aspart  0-9 Units Subcutaneous Q4H  . mupirocin ointment  1 application Nasal BID  . OLANZapine  5 mg Oral Daily  . pantoprazole  40 mg Oral Daily   Continuous Infusions: . sodium chloride 100 mL/hr at 09/19/17 0826  . piperacillin-tazobactam (ZOSYN)  IV Stopped (09/19/17 0809)  . vancomycin Stopped (09/18/17 1346)     LOS: 2 days    Time spent: 40 mins    Irine Seal, MD [facility] Pager [phone]  If 7PM-7AM, please contact night-coverage [email or web address] Password [facility] 09/19/2017, 10:22 AM

## 2017-09-19 NOTE — Evaluation (Signed)
Clinical/Bedside Swallow Evaluation Patient Details  Name: Stacy ShorterJanice Dorsey MRN: [id number] Date of Birth: 1945-10-24  Today's Date: 09/19/2017 Time: SLP Start Time (ACUTE ONLY): 1400 SLP Stop Time (ACUTE ONLY): 1415 SLP Time Calculation (min) (ACUTE ONLY): 15 min  Past Medical History:  Past Medical History:  Diagnosis Date  . CHF (congestive heart failure) (HCC)   . Diabetes mellitus without complication (HCC)   . Hypertension    Past Surgical History:  Past Surgical History:  Procedure Laterality Date  . CARPAL TUNNEL RELEASE    . CHOLECYSTECTOMY    . FEMUR SURGERY    . HERNIA REPAIR    . TUBAL LIGATION     HPI:  72 y.o. female with history of cardiomyopathy, diabetes mellitus, hypertension was recently discharged from rehab 4 days ago after being treated for left femur fracture was brought to the ER because of increasing confusion.  Since her discharge from the rehab patient had become more delirious not eating well with fever and chills.  Patient has had recurrent episodes of delirium during hospitalization and also patient has urinary tract infection as per the report.     Assessment / Plan / Recommendation Clinical Impression  Pt presented initially with coughing after drinking thin liquids, but coughing ceased after subsequent trials.  Despite confusion, there was adequate oral anticipation/mastication, brisk swallow response, and no further s/s of aspiration with solids or liquids.  Respiratory/swallowing coordination appeared to be adequate.  RN reported coughing at breakfast meal today.  SLP will f/u next date to ensure adequate toleration of POs.  D/W RN, pt's cousin.   SLP Visit Diagnosis: Dysphagia, unspecified (R13.10)    Aspiration Risk  Mild aspiration risk    Diet Recommendation   soft diet, thin liquids  Medication Administration: Whole meds with liquid    Other  Recommendations Oral Care Recommendations: Oral care BID   Follow up Recommendations Other  (comment)(tba)      Frequency and Duration min 2x/week  1 week       Prognosis Prognosis for Safe Diet Advancement: Good      Swallow Study   General HPI: 72 y.o. female with history of cardiomyopathy, diabetes mellitus, hypertension was recently discharged from rehab 4 days ago after being treated for left femur fracture was brought to the ER because of increasing confusion.  Since her discharge from the rehab patient had become more delirious not eating well with fever and chills.  Patient has had recurrent episodes of delirium during hospitalization and also patient has urinary tract infection as per the report.   Type of Study: Bedside Swallow Evaluation Previous Swallow Assessment: no Diet Prior to this Study: Dysphagia 3 (soft);Thin liquids Temperature Spikes Noted: No Respiratory Status: Room air History of Recent Intubation: No Behavior/Cognition: Alert;Confused Oral Cavity Assessment: Within Functional Limits Oral Care Completed by SLP: No Oral Cavity - Dentition: Adequate natural dentition Vision: Functional for self-feeding Self-Feeding Abilities: Needs assist Patient Positioning: Upright in bed Baseline Vocal Quality: Normal Volitional Cough: Strong Volitional Swallow: Able to elicit    Oral/Motor/Sensory Function Overall Oral Motor/Sensory Function: Within functional limits   Ice Chips Ice chips: Within functional limits   Thin Liquid Thin Liquid: Within functional limits    Nectar Thick Nectar Thick Liquid: Not tested   Honey Thick Honey Thick Liquid: Not tested   Puree Puree: Within functional limits   Solid   GO   Solid: Within functional limits        Blenda MountsCouture, Ricky Doan Laurice 09/19/2017,3:59 PM  Marchelle FolksAmanda  Jearld Shines, MA CCC/SLP Pager [phone]

## 2017-09-20 ENCOUNTER — Inpatient Hospital Stay (HOSPITAL_COMMUNITY): Payer: Medicare Other

## 2017-09-20 DIAGNOSIS — I429 Cardiomyopathy, unspecified: Secondary | ICD-10-CM

## 2017-09-20 DIAGNOSIS — N179 Acute kidney failure, unspecified: Secondary | ICD-10-CM

## 2017-09-20 DIAGNOSIS — N183 Chronic kidney disease, stage 3 (moderate): Secondary | ICD-10-CM

## 2017-09-20 DIAGNOSIS — E876 Hypokalemia: Secondary | ICD-10-CM

## 2017-09-20 LAB — MAGNESIUM: MAGNESIUM: 2.2 mg/dL (ref 1.7–2.4)

## 2017-09-20 LAB — COMPREHENSIVE METABOLIC PANEL
ALT: 32 U/L (ref 14–54)
AST: 55 U/L — ABNORMAL HIGH (ref 15–41)
Albumin: 2.3 g/dL — ABNORMAL LOW (ref 3.5–5.0)
Alkaline Phosphatase: 137 U/L — ABNORMAL HIGH (ref 38–126)
Anion gap: 12 (ref 5–15)
BILIRUBIN TOTAL: 1.4 mg/dL — AB (ref 0.3–1.2)
BUN: 15 mg/dL (ref 6–20)
CHLORIDE: 105 mmol/L (ref 101–111)
CO2: 21 mmol/L — ABNORMAL LOW (ref 22–32)
CREATININE: 0.96 mg/dL (ref 0.44–1.00)
Calcium: 8.3 mg/dL — ABNORMAL LOW (ref 8.9–10.3)
GFR, EST NON AFRICAN AMERICAN: 59 mL/min — AB (ref >60)
Glucose, Bld: 168 mg/dL — ABNORMAL HIGH (ref 65–99)
Potassium: 3.9 mmol/L (ref 3.5–5.1)
Sodium: 138 mmol/L (ref 135–145)
TOTAL PROTEIN: 5.9 g/dL — AB (ref 6.5–8.1)

## 2017-09-20 LAB — CBC WITH DIFFERENTIAL/PLATELET
Basophils Absolute: 0 10*3/uL (ref 0.0–0.1)
Basophils Relative: 0 %
EOS PCT: 1 %
Eosinophils Absolute: 0.1 10*3/uL (ref 0.0–0.7)
HEMATOCRIT: 35.9 % — AB (ref 36.0–46.0)
Hemoglobin: 11.2 g/dL — ABNORMAL LOW (ref 12.0–15.0)
LYMPHS ABS: 1.3 10*3/uL (ref 0.7–4.0)
LYMPHS PCT: 18 %
MCH: 26.6 pg (ref 26.0–34.0)
MCHC: 31.2 g/dL (ref 30.0–36.0)
MCV: 85.3 fL (ref 78.0–100.0)
Monocytes Absolute: 0.6 10*3/uL (ref 0.1–1.0)
Monocytes Relative: 8 %
NEUTROS ABS: 5.1 10*3/uL (ref 1.7–7.7)
Neutrophils Relative %: 73 %
PLATELETS: 169 10*3/uL (ref 150–400)
RBC: 4.21 MIL/uL (ref 3.87–5.11)
RDW: 20.2 % — ABNORMAL HIGH (ref 11.5–15.5)
WBC: 7.1 10*3/uL (ref 4.0–10.5)

## 2017-09-20 LAB — CULTURE, BLOOD (ROUTINE X 2): SPECIAL REQUESTS: ADEQUATE

## 2017-09-20 LAB — GLUCOSE, CAPILLARY
GLUCOSE-CAPILLARY: 181 mg/dL — AB (ref 65–99)
Glucose-Capillary: 137 mg/dL — ABNORMAL HIGH (ref 65–99)
Glucose-Capillary: 144 mg/dL — ABNORMAL HIGH (ref 65–99)

## 2017-09-20 MED ORDER — AMOXICILLIN-POT CLAVULANATE 875-125 MG PO TABS
1.0000 | ORAL_TABLET | Freq: Two times a day (BID) | ORAL | Status: DC
  Administered 2017-09-20 – 2017-09-23 (×6): 1 via ORAL
  Filled 2017-09-20 (×7): qty 1

## 2017-09-20 MED ORDER — STARCH (THICKENING) PO POWD
ORAL | Status: DC | PRN
  Filled 2017-09-20: qty 227

## 2017-09-20 MED ORDER — RESOURCE THICKENUP CLEAR PO POWD
ORAL | Status: DC | PRN
  Filled 2017-09-20: qty 125

## 2017-09-20 MED ORDER — QUETIAPINE FUMARATE 25 MG PO TABS
12.5000 mg | ORAL_TABLET | Freq: Every day | ORAL | Status: DC
  Administered 2017-09-20 – 2017-09-21 (×2): 12.5 mg via ORAL
  Filled 2017-09-20 (×3): qty 1

## 2017-09-20 NOTE — Evaluation (Signed)
Occupational Therapy Evaluation Patient Details Name: Stacy Dorsey MRN: [id number] DOB: 1945-12-29 Today's Date: 09/20/2017    History of Present Illness Stacy Dorsey is a 72 y.o. female with history of cardiomyopathy, diabetes mellitus, hypertension was recently discharged after being treated for left femur fracture; pt  brought to the ER because of increasing confusion, admitted 09/17/17 for acute encephalopathy with edema and possible developing sepsis                  Clinical Impression   This 72 yo female admitted with above presents to acute OT with decreased cognition, decreased balance, increased pain, decreased mobility all affecting her ability to care for herself at home. She will benefit from acute OT with follow up OT at SNF to work back towards a more independent level.    Follow Up Recommendations  SNF;Supervision/Assistance - 24 hour    Equipment Recommendations  None recommended by OT       Precautions / Restrictions Precautions Precautions: Fall Restrictions Weight Bearing Restrictions: No      Mobility Bed Mobility Overal bed mobility: Needs Assistance Bed Mobility: Supine to Sit;Rolling;Sit to Supine Rolling: Min assist(to right and left)   Supine to sit: +2 for physical assistance;Max assist;+2 for safety/equipment Sit to supine: Max assist;+2 for physical assistance;+2 for safety/equipment   General bed mobility comments: pt requires assist to bring LEs on/off bed and assist to elevate and control descent of trunk; multi-modal cues for sequencing and  self assist  Transfers Overall transfer level: Needs assistance Equipment used: 2 person hand held assist Transfers: Sit to/from Stand Sit to Stand: Total assist;+2 physical assistance;+2 safety/equipment         General transfer comment: attempted sit to stand x2, pt unable to WB through LEs, bed pad used to bring hips into extension however pt unable self assist so lowered back to bed    Balance  Overall balance assessment: Needs assistance Sitting-balance support: Feet supported;Single extremity supported Sitting balance-Leahy Scale: Fair Sitting balance - Comments: briefly able to maintain static sit, unabel to wt shift without LOB Postural control: Left lateral lean   Standing balance-Leahy Scale: Zero                             ADL either performed or assessed with clinical judgement   ADL Overall ADL's : Needs assistance/impaired Eating/Feeding: Total assistance;Bed level   Grooming: Wash/dry face;Maximal assistance;Bed level Grooming Details (indicate cue type and reason): put washcloth in pt's hand and A'd pt with hand to face. Pt started washing face but then stopped, had to help repostion her hand to face but then she did not attempt to wash her face anymmore Upper Body Bathing: Total assistance;Bed level   Lower Body Bathing: Total assistance;Bed level   Upper Body Dressing : Total assistance;Bed level   Lower Body Dressing: Total assistance;Bed level                       Vision   Additional Comments: Pt kept her eyes closed ~90% of session            Pertinent Vitals/Pain Pain Assessment: Faces Faces Pain Scale: Hurts a little bit Pain Location: back Pain Descriptors / Indicators: Grimacing Pain Intervention(s): Monitored during session     Hand Dominance  right   Extremity/Trunk Assessment Upper Extremity Assessment Upper Extremity Assessment: Generalized weakness   Lower Extremity Assessment Lower Extremity Assessment: Generalized weakness  Communication Communication Communication: Other (comment)(difficult to understand)   Cognition Arousal/Alertness: Lethargic Behavior During Therapy: Flat affect Overall Cognitive Status: Impaired/Different from baseline Area of Impairment: Orientation;Following commands;Safety/judgement;Problem solving                 Orientation Level: Disoriented  to;Place;Time;Situation     Following Commands: Follows one step commands with increased time;Follows multi-step commands inconsistently Safety/Judgement: Decreased awareness of safety;Decreased awareness of deficits   Problem Solving: Slow processing;Decreased initiation;Difficulty sequencing;Requires verbal cues;Requires tactile cues General Comments: speech is difficult to understand, eyes closed >90% of time              Home Living Family/patient expects to be discharged to:: Skilled nursing facility                                        Prior Functioning/Environment          Comments: unable to determine PLOF as pt is not a reliable historian at this time d/t lethargy and confusion        OT Problem List: Decreased strength;Decreased range of motion;Decreased activity tolerance;Impaired balance (sitting and/or standing);Pain;Decreased safety awareness;Decreased cognition;Impaired UE functional use;Decreased coordination;Obesity;Decreased knowledge of use of DME or AE      OT Treatment/Interventions: Self-care/ADL training;Balance training;Therapeutic activities;Cognitive remediation/compensation;DME and/or AE instruction;Patient/family education    OT Goals(Current goals can be found in the care plan section) Acute Rehab OT Goals Patient Stated Goal: said she wanted to get up, but then unable to A with mobility OT Goal Formulation: Patient unable to participate in goal setting Time For Goal Achievement: 10/04/17 Potential to Achieve Goals: Fair ADL Goals Pt Will Perform Grooming: bed level;with min assist Pt Will Transfer to Toilet: with mod assist;with +2 assist;stand pivot transfer;squat pivot transfer;bedside commode Additional ADL Goal #1: Pt will be Mod A to come up to sit EOB for basic ADLs and transfers Additional ADL Goal #2: Pt will follow 2 out of 5 one step commands  OT Frequency: Min 2X/week   Barriers to D/C: Decreased caregiver  support          Co-evaluation PT/OT/SLP Co-Evaluation/Treatment: Yes Reason for Co-Treatment: Necessary to address cognition/behavior during functional activity PT goals addressed during session: Mobility/safety with mobility OT goals addressed during session: ADL's and self-care;Strengthening/ROM      AM-PAC PT "6 Clicks" Daily Activity     Outcome Measure Help from another person eating meals?: Total Help from another person taking care of personal grooming?: Total Help from another person toileting, which includes using toliet, bedpan, or urinal?: Total Help from another person bathing (including washing, rinsing, drying)?: Total Help from another person to put on and taking off regular upper body clothing?: Total Help from another person to put on and taking off regular lower body clothing?: Total 6 Click Score: 6   End of Session Equipment Utilized During Treatment: Gait belt Nurse Communication: Mobility status(cleaned pt up from bowel movement)  Activity Tolerance: Patient limited by fatigue;Patient limited by lethargy Patient left: in bed;with call bell/phone within reach;with bed alarm set  OT Visit Diagnosis: Other abnormalities of gait and mobility (R26.89);Muscle weakness (generalized) (M62.81);Other symptoms and signs involving cognitive function;Pain Pain - part of body: (back)                Time: [id number] OT Time Calculation (min): 21 min Charges:  OT General Charges $OT Visit: 1 Visit OT Evaluation $  OT Eval Moderate Complexity: [address] Ignacia Palma, [place] [phone] 09/20/2017

## 2017-09-20 NOTE — Evaluation (Addendum)
Physical Therapy Evaluation Patient Details Name: Stacy ShorterJanice Dorsey MRN: [id number] DOB: September 13, 1945 Today's Date: 09/20/2017   History of Present Illness  Stacy Dorsey is a 72 y.o. female with history of cardiomyopathy, diabetes mellitus, hypertension was recently discharged after being treated for left femur fracture; pt  brought to the ER because of increasing confusion, admitted 09/17/17 for acute encephalopathy with edema and possible developing sepsis                 Clinical Impression  Pt admitted with above diagnosis. Pt currently with functional limitations due to the deficits listed below (see PT Problem List). Pt is lethargic at time of eval, keeps eyes closed most of time although is verbally responsive but difficult to understand; unable to determine baseline, will need SNF post acute;  Pt will benefit from skilled PT to increase their independence and safety with mobility to allow discharge to the venue listed below.  Will follow in acute setting     Follow Up Recommendations SNF    Equipment Recommendations  None recommended by PT    Recommendations for Other Services       Precautions / Restrictions Precautions Precautions: Fall Restrictions Weight Bearing Restrictions: No  Per MD note pt WBAT (recent femur fx)     Mobility  Bed Mobility Overal bed mobility: Needs Assistance Bed Mobility: Supine to Sit;Rolling;Sit to Supine Rolling: Min assist(to right and left)   Supine to sit: +2 for physical assistance;Max assist;+2 for safety/equipment Sit to supine: Max assist;+2 for physical assistance;+2 for safety/equipment   General bed mobility comments: pt requires assist to bring LEs on/off bed and assist to elevate and control descent of trunk; multi-modal cues for sequencing and  self assist  Transfers Overall transfer level: Needs assistance Equipment used: 2 person hand held assist Transfers: Sit to/from Stand Sit to Stand: Total assist;+2 physical assistance;+2  safety/equipment         General transfer comment: attempted sit to stand x2, pt unable to WB through LEs, bed pad used to bring hips into extension however pt unable self assist so lowered back to bed  Ambulation/Gait             General Gait Details: unable/NT  Stairs            Wheelchair Mobility    Modified Rankin (Stroke Patients Only)       Balance Overall balance assessment: Needs assistance Sitting-balance support: Feet supported;Single extremity supported Sitting balance-Leahy Scale: Fair Sitting balance - Comments: briefly able to maintain static sit, unabel to wt shift without LOB Postural control: Left lateral lean   Standing balance-Leahy Scale: Zero                               Pertinent Vitals/Pain Pain Assessment: Faces Faces Pain Scale: Hurts a little bit Pain Location: back Pain Descriptors / Indicators: Grimacing Pain Intervention(s): Monitored during session    Home Living Family/patient expects to be discharged to:: Skilled nursing facility                      Prior Function           Comments: unable to determine PLOF as pt is not a reliable historian at this time d/t lethargy and confusion     Hand Dominance        Extremity/Trunk Assessment   Upper Extremity Assessment Upper Extremity Assessment: Generalized weakness  Lower Extremity Assessment Lower Extremity Assessment: Generalized weakness       Communication   Communication: Other (comment)(difficult to understand)  Cognition Arousal/Alertness: Lethargic Behavior During Therapy: Flat affect Overall Cognitive Status: Impaired/Different from baseline Area of Impairment: Orientation;Following commands;Safety/judgement;Problem solving                 Orientation Level: Disoriented to;Place;Time;Situation     Following Commands: Follows one step commands with increased time;Follows multi-step commands  inconsistently Safety/Judgement: Decreased awareness of safety;Decreased awareness of deficits   Problem Solving: Slow processing;Decreased initiation;Difficulty sequencing;Requires verbal cues;Requires tactile cues General Comments: speech is difficult to understand, eyes closed >90% of time      General Comments      Exercises     Assessment/Plan    PT Assessment Patient needs continued PT services  PT Problem List Decreased strength;Decreased mobility;Decreased activity tolerance;Decreased cognition;Decreased balance;Decreased safety awareness       PT Treatment Interventions DME instruction;Gait training;Functional mobility training;Therapeutic activities;Therapeutic exercise;Patient/family education;Balance training    PT Goals (Current goals can be found in the Care Plan section)  Acute Rehab PT Goals Patient Stated Goal: said she wanted to get up, but then unable to A with mobility PT Goal Formulation: Patient unable to participate in goal setting Time For Goal Achievement: 10/04/17 Potential to Achieve Goals: Fair    Frequency Min 2X/week   Barriers to discharge        Co-evaluation PT/OT/SLP Co-Evaluation/Treatment: Yes Reason for Co-Treatment: Necessary to address cognition/behavior during functional activity PT goals addressed during session: Mobility/safety with mobility OT goals addressed during session: ADL's and self-care;Strengthening/ROM       AM-PAC PT "6 Clicks" Daily Activity  Outcome Measure Difficulty turning over in bed (including adjusting bedclothes, sheets and blankets)?: Unable Difficulty moving from lying on back to sitting on the side of the bed? : Unable Difficulty sitting down on and standing up from a chair with arms (e.g., wheelchair, bedside commode, etc,.)?: Unable Help needed moving to and from a bed to chair (including a wheelchair)?: Total Help needed walking in hospital room?: Total Help needed climbing 3-5 steps with a railing?  : Total 6 Click Score: 6    End of Session Equipment Utilized During Treatment: Gait belt Activity Tolerance: Patient limited by lethargy;Patient limited by fatigue Patient left: in bed;with bed alarm set;with call bell/phone within reach   PT Visit Diagnosis: Muscle weakness (generalized) (M62.81);Other abnormalities of gait and mobility (R26.89)    Time: [id number] PT Time Calculation (min) (ACUTE ONLY): 25 min   Charges:   PT Evaluation $PT Eval Moderate Complexity: 1 Mod     PT G CodesDrucilla Chalet, PT Pager: [phone] 09/20/2017   [facility] 09/20/2017, 12:23 PM

## 2017-09-20 NOTE — Progress Notes (Addendum)
PROGRESS NOTE    Stacy Dorsey  ZDG:[id number] DOB: 07-14-1946 DOA: 09/17/2017 PCP: Patient, No Pcp Per    Brief Narrative:  Stacy Dorsey is a 72 y.o. female with history of cardiomyopathy, diabetes mellitus, hypertension was recently discharged from rehab 4 days ago after being treated for left femur fracture was brought to the ER because of increasing confusion.  Since her discharge from the rehab patient had become more delirious not eating well with fever and chills.  Patient has had recurrent episodes of delirium during hospitalization and also patient has urinary tract infection as per the report.    ED Course: In the ER patient is tachycardic fever of 101.  Patient's chest x-ray was unremarkable.  Lactate levels were elevated and had leukocytosis.  UA shows features consistent with UTI.  Since LFTs were elevated sonogram of the right upper quadrant was done which was unremarkable.  CT head and CT abdomen are pending.  Patient was started on empiric antibiotics and fluids and admitted for acute encephalopathy with edema and possible developing sepsis.  Flu panel is negative.     Assessment & Plan:   Principal Problem:   Acute encephalopathy Active Problems:   Sepsis (HCC)   Acute lower UTI   CAD (coronary artery disease)   Cardiomyopathy (HCC)   Essential hypertension   Pressure injury of skin   Acute cystitis without hematuria  #1 acute encephalopathy Questionable etiology.  May be secondary to sepsis secondary to probable acute UTI.  Blood cultures have been ordered in 1 out of 4 bottles with staph species which are coagulase-negative and felt likely to be a contaminant.  Patient more alert and following commands.  Likely getting close to baseline.  Patient with underlying dementia and unknown baseline.  Urine cultures with multiple species present.  Patient currently afebrile.  Labs pending.  Continue IV fluids, change empiric IV Zosyn to oral Augmentin to complete antibiotic  course.  2.  Sepsis likely secondary to probable UTI Questionable etiology.  Concern that sepsis likely secondary to UTI.  Blood cultures with 1/4 bottles with coagulase-negative staph felt likely to be a contaminant.  Urine cultures with multiple species.  Urinalysis however had large leukocytes too numerous to count WBCs.  Lactic acid levels trended down.  Patient afebrile. Labs pending for this morning.  Change IV Zosyn to oral to complete course of antibiotic treatment.  IV vancomycin was discontinued on 09/19/2017.    3.  Borderline blood pressure Patient with systolic blood pressures in the 90-100s.  Concern for sepsis.  Patient has been pancultured.  Urine cultures with multiple species.  Blood cultures pending with 1/4 bottles with coagulase-negative staph.  Continue gentle hydration with supportive care.  Continue current antibiotics. Follow.  4.  Hypertension Continue to hold antihypertensive medication secondary to problem #3.  5.  Acute renal failure on chronic kidney disease stage III Felt secondary to prerenal azotemia secondary to poor oral intake.  CT abdomen and pelvis negative for hydronephrosis.  Renal function improving.  Labs pending this morning.  Continue gentle hydration.  6.  Normocytic anemia/iron deficiency anemia CBC pending for this morning.  No overt bleeding noted.  Follow H&H.  7.  History of coronary artery disease status post multiple stents Currently stable.  Patient denies any chest pain.  Patient's cardiology office visit 07/24/2017 patient noted to be on aspirin and Plavix at that time.  On patient's home med rec Eliquis is noted questionable etiology.  Outpatient follow-up with cardiology.  8.  Elevated LFTs Questionable etiology.  May be secondary to developing sepsis.  Labs pending for this morning.  LFTs trending down.  Right upper quadrant ultrasound negative for acute cholecystitis.  CT abdomen and pelvis negative for bowel obstruction or bowel wall  thickening.  Small left greater than right pleural effusions with bibasilar atelectasis or minimal infiltrate.  Fat-containing ventral hernias.  Diffuse subcutaneous edema with fluid in the left greater than right flank.  Hold statin.  Follow.  9.  Hypokalemia/hypomagnesemia Repleted.  Labs pending.  10.  Dementia Patient with noted agitation 2 nights ago seem to be improving after starting patient on Seroquel 3 times daily.  Continue Zyprexa.  11.  Gastroesophageal reflux disease Continue PPI.  #12 ??  Chronic anticoagulation Patient noted on admission to be on Eliquis.  Will need to find out from family why patient is on anticoagulation.  Seems patient's Eliquis may have been started by orthopedics as it is noted on the a note from 09/10/2017.Per family patient is to follow-up with cardiology early next week and will defer chronic anticoagulation to cardiology on outpatient follow-up.  13.  Diabetes mellitus CBGs have ranged from 171 -186.  Continue sliding scale insulin.  14.  Left supracondylar femur fracture Patient being followed by Dr. Ilda Basset orthopedics in [facility] and was last seen on 09/10/2017.  Per orthopedics note patient okay for full weightbearing as tolerated.  PT/OT.  Patient with no overt bleeding and as such resumed Eliquis for possible DVT prophylaxis.  15.  Hypokalemia Patient given potassium supplementation yesterday.  Labs pending.  DVT prophylaxis: SCDs Code Status: Full Family Communication: Updated patient.  No family at bedside. Disposition Plan: Pending hospital course.  Home with home health versus skilled nursing facility once medically stable, improvement with sepsis, back to baseline.   Consultants:   None  Procedures:   CT head 09/18/2017  CT abdomen and pelvis 09/18/2017  Right upper quadrant ultrasound 09/17/2017  Chest x-ray 09/17/2017  Antimicrobials:   IV vancomycin 09/18/2017>>>>> 09/19/2017  IV Zosyn 09/18/2017>>>>>>> 09/20/2017  Augmentin  09/20/2017   Subjective: Patient sitting up visit.  Alert to self.  States she is in her bedroom in the hospital.  Patient thinks it is December.  Patient does not know what year it is.  Patient however knows that Trump is the president.  Per Nursing patient did not sleep that well overnight.  Objective: Vitals:   09/20/17 0340 09/20/17 0400 09/20/17 0439 09/20/17 0742  BP:  92/67    Pulse:  (!) 113 (!) 115   Resp:  (!) 22 18   Temp: 98.6 F (37 C)   98.1 F (36.7 C)  TempSrc: Axillary   Axillary  SpO2:  95% 99%   Weight:      Height:        Intake/Output Summary (Last 24 hours) at 09/20/2017 0910 Last data filed at 09/20/2017 0600 Gross per 24 hour  Intake 2546.67 ml  Output -  Net 2546.67 ml   Filed Weights   09/17/17 2225 09/18/17 2007  Weight: 78 kg (172 lb) 86.1 kg (189 lb 13.1 oz)    Examination:  General exam: Alert. Respiratory system: Clear to auscultation anterior lung fields.  No crackles. No wheezes no rhonchi. Respiratory effort normal. Cardiovascular system: Regular rate rhythm no murmurs rubs or gallops.  No lower extremity edema.  No JVD.  Gastrointestinal system: Abdomen is soft, nontender, nondistended, positive bowel sounds.  No rebound.  No guarding.  Central nervous system: Patient alert  to self.  Moving extremities spontaneously.  Extremities: Symmetric 5 x 5 power. Skin: No rashes, lesions or ulcers Psychiatry: Judgement and insight appear fair. Mood & affect appropriate.     Data Reviewed: I have personally reviewed following labs and imaging studies  CBC: Recent Labs  Lab 09/17/17 2126 09/18/17 0857 09/19/17 1032  WBC 10.4 8.2 8.2  NEUTROABS 8.0* 5.6  --   HGB 11.5* 10.8* 11.1*  HCT 35.1* 34.1* 34.4*  MCV 84.2 85.9 83.3  PLT 245 212 211   Basic Metabolic Panel: Recent Labs  Lab 09/17/17 2126 09/18/17 0857 09/19/17 1032  NA 138 137 139  K 4.8 3.3* 3.0*  CL 97* 100* 102  CO2 26 23 26   GLUCOSE 128* 144* 108*  BUN 20 20 17     CREATININE 1.48* 1.34* 1.05*  CALCIUM 8.3* 8.2* 8.3*  MG  --  1.8 2.5*   GFR: Estimated Creatinine Clearance: 50.4 mL/min (A) (by C-G formula based on SCr of 1.05 mg/dL (H)). Liver Function Tests: Recent Labs  Lab 09/17/17 2126 09/18/17 0857 09/19/17 1032  AST 133* 133* 81*  ALT 42 42 35  ALKPHOS 151* 146* 142*  BILITOT 1.9* 1.5* 1.6*  PROT 5.9* 5.9* 6.0*  ALBUMIN 2.4* 2.3* 2.4*   No results for input(s): LIPASE, AMYLASE in the last 168 hours. Recent Labs  Lab 09/18/17 0857  AMMONIA 24   Coagulation Profile: No results for input(s): INR, PROTIME in the last 168 hours. Cardiac Enzymes: No results for input(s): CKTOTAL, CKMB, CKMBINDEX, TROPONINI in the last 168 hours. BNP (last 3 results) No results for input(s): PROBNP in the last 8760 hours. HbA1C: No results for input(s): HGBA1C in the last 72 hours. CBG: Recent Labs  Lab 09/19/17 0420 09/19/17 0740 09/19/17 1217 09/19/17 1630 09/19/17 2108  GLUCAP 136* 88 95 186* 171*   Lipid Profile: No results for input(s): CHOL, HDL, LDLCALC, TRIG, CHOLHDL, LDLDIRECT in the last 72 hours. Thyroid Function Tests: Recent Labs    09/18/17 0857  TSH 5.117*   Anemia Panel: Recent Labs    09/18/17 0857  VITAMINB12 1,410*  FOLATE 25.8  FERRITIN 224  TIBC 213*  IRON 26*  RETICCTPCT 3.5*   Sepsis Labs: Recent Labs  Lab 09/17/17 2130 09/17/17 2245 09/18/17 0857  PROCALCITON  --   --  0.13  LATICACIDVEN 2.71* 2.20* 1.8    Recent Results (from the past 240 hour(s))  Blood Culture (routine x 2)     Status: None (Preliminary result)   Collection Time: 09/17/17  9:26 PM  Result Value Ref Range Status   Specimen Description   Final    BLOOD RIGHT FOREARM Performed at [facility], [address] [place], [place] [address]    Special Requests   Final    BOTTLES DRAWN AEROBIC AND ANAEROBIC Blood Culture adequate volume Performed at [facility], [address]  [place], [place] [address]    Culture   Final    NO GROWTH 2 DAYS Performed at [facility], [address] [place], [place] [address]    Report Status PENDING  Incomplete  Blood Culture (routine x 2)     Status: Abnormal   Collection Time: 09/17/17  9:26 PM  Result Value Ref Range Status   Specimen Description   Final    BLOOD RIGHT ARM Performed at [facility], [address] [place], [place] [address]    Special Requests   Final    BOTTLES DRAWN  AEROBIC AND ANAEROBIC Blood Culture adequate volume Performed at [facility], [address], [place], [place] [address]    Culture  Setup Time   Final    GRAM POSITIVE COCCI AEROBIC BOTTLE ONLY CRITICAL RESULT CALLED TO, READ BACK BY AND VERIFIED WITH: B GREEN PHARMD 09/19/17 0240 JDW    Culture (A)  Final    STAPHYLOCOCCUS SPECIES (COAGULASE NEGATIVE) THE SIGNIFICANCE OF ISOLATING THIS ORGANISM FROM A SINGLE SET OF BLOOD CULTURES WHEN MULTIPLE SETS ARE DRAWN IS UNCERTAIN. PLEASE NOTIFY THE MICROBIOLOGY DEPARTMENT WITHIN ONE WEEK IF SPECIATION AND SENSITIVITIES ARE REQUIRED. Performed at [facility], [address], [place], [place] [address]    Report Status 09/20/2017 FINAL  Final  Urine culture     Status: Abnormal   Collection Time: 09/17/17  9:26 PM  Result Value Ref Range Status   Specimen Description   Final    URINE, CATHETERIZED Performed at [facility], [address], [place], [place] [address]    Special Requests   Final    NONE Performed at [facility], [address] [place], [place] [address]    Culture MULTIPLE SPECIES PRESENT, SUGGEST RECOLLECTION (A)  Final   Report Status 09/19/2017 FINAL  Final  Blood Culture ID Panel (Reflexed)     Status: Abnormal   Collection Time: 09/17/17  9:26 PM  Result Value Ref Range Status   Enterococcus species NOT DETECTED NOT DETECTED Final   Listeria monocytogenes NOT DETECTED NOT DETECTED  Final   Staphylococcus species DETECTED (A) NOT DETECTED Final    Comment: Methicillin (oxacillin) resistant coagulase negative staphylococcus. Possible blood culture contaminant (unless isolated from more than one blood culture draw or clinical case suggests pathogenicity). No antibiotic treatment is indicated for blood  culture contaminants. CRITICAL RESULT CALLED TO, READ BACK BY AND VERIFIED WITH: B GREEN PHARMD 09/19/17 0240 JDW    Staphylococcus aureus NOT DETECTED NOT DETECTED Final   Methicillin resistance DETECTED (A) NOT DETECTED Final    Comment: CRITICAL RESULT CALLED TO, READ BACK BY AND VERIFIED WITH: B GREEN PHARMD 09/19/17 0240 JDW    Streptococcus species NOT DETECTED NOT DETECTED Final   Streptococcus agalactiae NOT DETECTED NOT DETECTED Final   Streptococcus pneumoniae NOT DETECTED NOT DETECTED Final   Streptococcus pyogenes NOT DETECTED NOT DETECTED Final   Acinetobacter baumannii NOT DETECTED NOT DETECTED Final   Enterobacteriaceae species NOT DETECTED NOT DETECTED Final   Enterobacter cloacae complex NOT DETECTED NOT DETECTED Final   Escherichia coli NOT DETECTED NOT DETECTED Final   Klebsiella oxytoca NOT DETECTED NOT DETECTED Final   Klebsiella pneumoniae NOT DETECTED NOT DETECTED Final   Proteus species NOT DETECTED NOT DETECTED Final   Serratia marcescens NOT DETECTED NOT DETECTED Final   Haemophilus influenzae NOT DETECTED NOT DETECTED Final   Neisseria meningitidis NOT DETECTED NOT DETECTED Final   Pseudomonas aeruginosa NOT DETECTED NOT DETECTED Final   Candida albicans NOT DETECTED NOT DETECTED Final   Candida glabrata NOT DETECTED NOT DETECTED Final   Candida krusei NOT DETECTED NOT DETECTED Final   Candida parapsilosis NOT DETECTED NOT DETECTED Final   Candida tropicalis NOT DETECTED NOT DETECTED Final    Comment: Performed at [facility], [address], [place], [place] [address]  MRSA PCR Screening     Status: Abnormal   Collection Time:  09/18/17  9:08 PM  Result Value Ref Range Status   MRSA by PCR POSITIVE (A) NEGATIVE Final  Comment:        The GeneXpert MRSA Assay (FDA approved for NASAL specimens only), is one component of a comprehensive MRSA colonization surveillance program. It is not intended to diagnose MRSA infection nor to guide or monitor treatment for MRSA infections. RESULT CALLED TO, READ BACK BY AND VERIFIED WITH: Stephanie Acre [id number] @ 2330 BY J SCOTTON Performed at [facility], [address], [place], [place] [address]          Radiology Studies: No results found.      Scheduled Meds: . apixaban  5 mg Oral BID  . Chlorhexidine Gluconate Cloth  6 each Topical Q0600  . dorzolamide  1 drop Both Eyes TID  . insulin aspart  0-9 Units Subcutaneous TID WC  . mupirocin ointment  1 application Nasal BID  . OLANZapine  5 mg Oral Daily  . pantoprazole  40 mg Oral Daily  . QUEtiapine  12.5 mg Oral TID   Continuous Infusions: . sodium chloride 100 mL/hr at 09/20/17 0229  . piperacillin-tazobactam (ZOSYN)  IV Stopped (09/20/17 9811)     LOS: 3 days    Time spent: 35 mins    Ramiro Harvest, MD [facility] Pager [phone]  If 7PM-7AM, please contact night-coverage [email or web address] Password [email or web address] 09/20/2017, 9:10 AM

## 2017-09-20 NOTE — Progress Notes (Signed)
  Speech Language Pathology Treatment: Dysphagia  Patient Details Name: Stacy ShorterJanice Goguen MRN: [id number] DOB: 08-28-1945 Today's Date: 09/20/2017 Time: [id number] SLP Time Calculation (min) (ACUTE ONLY): 20 min  Assessment / Plan / Recommendation Clinical Impression  Pt seen at bedside after BSE completed yesterday. Per RN, pt mental status is highly variable. Pt roused easily, but kept eyes closed during po trials. Pt demonstrated no awareness of bolus of grilled cheese sandwich, so bolus was removed by SLP. Pt accepted boluses of magic cup, which she appeared to tolerate well. Cup sips of thin liquid resulted in intermittent cough response, raising concern for airway compromise. Second bite of grilled cheese was ineffectively chewed, and also removed from pt oral cavity. Given highly variable mentation and concern for aspiration, will downgrade diet to puree and nectar thick liquid at this time. Anticipate liberalizing diet at bedside once pt strength, endurance, and mental status improves. Safe swallow precautions posted at [facility] to follow for assessment of diet tolerance and education. RN informed.     HPI HPI: 72 y.o. female with history of cardiomyopathy, diabetes mellitus, hypertension was recently discharged from rehab 4 days ago after being treated for left femur fracture was brought to the ER because of increasing confusion.  Since her discharge from the rehab patient had become more delirious not eating well with fever and chills.  Patient has had recurrent episodes of delirium during hospitalization and also patient has urinary tract infection as per the report.        SLP Plan  Continue with current plan of care       Recommendations  Diet recommendations: Dysphagia 1 (puree);Nectar-thick liquid Liquids provided via: Cup;No straw Medication Administration: Crushed with puree Supervision: Staff to assist with self feeding;Full supervision/cueing for compensatory  strategies Compensations: Minimize environmental distractions;Slow rate;Small sips/bites;Other (Comment)(check oral cavity between bites and after meal) Postural Changes and/or Swallow Maneuvers: Seated upright 90 degrees;Upright 30-60 min after meal                Oral Care Recommendations: Oral care BID Follow up Recommendations: (TBD) SLP Visit Diagnosis: Dysphagia, unspecified (R13.10) Plan: Continue with current plan of care       GO               Gregory Dowe B. Murvin NatalBueche, [facility], CCC-SLP Speech Language Pathologist [phone]  Leigh AuroraBueche, Darah Simkin Brown 09/20/2017, 2:58 PM

## 2017-09-21 ENCOUNTER — Encounter (HOSPITAL_COMMUNITY): Payer: Self-pay | Admitting: Internal Medicine

## 2017-09-21 DIAGNOSIS — I639 Cerebral infarction, unspecified: Secondary | ICD-10-CM

## 2017-09-21 DIAGNOSIS — L8992 Pressure ulcer of unspecified site, stage 2: Secondary | ICD-10-CM

## 2017-09-21 DIAGNOSIS — I480 Paroxysmal atrial fibrillation: Secondary | ICD-10-CM

## 2017-09-21 HISTORY — DX: Paroxysmal atrial fibrillation: I48.0

## 2017-09-21 HISTORY — DX: Cerebral infarction, unspecified: I63.9

## 2017-09-21 LAB — BASIC METABOLIC PANEL
Anion gap: 13 (ref 5–15)
BUN: 13 mg/dL (ref 6–20)
CO2: 22 mmol/L (ref 22–32)
CREATININE: 0.87 mg/dL (ref 0.44–1.00)
Calcium: 8.3 mg/dL — ABNORMAL LOW (ref 8.9–10.3)
Chloride: 106 mmol/L (ref 101–111)
GFR calc Af Amer: >60 mL/min (ref >60)
GLUCOSE: 136 mg/dL — AB (ref 65–99)
Potassium: 3.8 mmol/L (ref 3.5–5.1)
SODIUM: 141 mmol/L (ref 135–145)

## 2017-09-21 LAB — CBC
HCT: 33.8 % — ABNORMAL LOW (ref 36.0–46.0)
Hemoglobin: 10.8 g/dL — ABNORMAL LOW (ref 12.0–15.0)
MCH: 27 pg (ref 26.0–34.0)
MCHC: 32 g/dL (ref 30.0–36.0)
MCV: 84.5 fL (ref 78.0–100.0)
PLATELETS: 153 10*3/uL (ref 150–400)
RBC: 4 MIL/uL (ref 3.87–5.11)
RDW: 20.2 % — ABNORMAL HIGH (ref 11.5–15.5)
WBC: 5.9 10*3/uL (ref 4.0–10.5)

## 2017-09-21 LAB — GLUCOSE, CAPILLARY
GLUCOSE-CAPILLARY: 128 mg/dL — AB (ref 65–99)
GLUCOSE-CAPILLARY: 164 mg/dL — AB (ref 65–99)
GLUCOSE-CAPILLARY: 147 mg/dL — AB (ref 65–99)
Glucose-Capillary: 154 mg/dL — ABNORMAL HIGH (ref 65–99)
Glucose-Capillary: 153 mg/dL — ABNORMAL HIGH (ref 65–99)

## 2017-09-21 MED ORDER — TRAZODONE HCL 50 MG PO TABS
50.0000 mg | ORAL_TABLET | Freq: Once | ORAL | Status: AC
  Administered 2017-09-22: 50 mg via ORAL
  Filled 2017-09-21: qty 1

## 2017-09-21 MED ORDER — DILTIAZEM HCL 30 MG PO TABS
30.0000 mg | ORAL_TABLET | Freq: Three times a day (TID) | ORAL | Status: DC
  Administered 2017-09-21 – 2017-09-23 (×5): 30 mg via ORAL
  Filled 2017-09-21 (×7): qty 1

## 2017-09-21 NOTE — Progress Notes (Addendum)
PROGRESS NOTE    Stacy ShorterJanice Dorsey  ZOX:[id number] DOB: [birth year] DOA: 09/17/2017 PCP: Patient, No Pcp Per    Brief Narrative:  Stacy ShorterJanice Dorsey is a 72 y.o. female with history of cardiomyopathy, diabetes mellitus, hypertension was recently discharged from rehab 4 days ago after being treated for left femur fracture was brought to the ER because of increasing confusion.  Since her discharge from the rehab patient had become more delirious not eating well with fever and chills.  Patient has had recurrent episodes of delirium during hospitalization and also patient has urinary tract infection as per the report.    ED Course: In the ER patient is tachycardic fever of 101.  Patient's chest x-ray was unremarkable.  Lactate levels were elevated and had leukocytosis.  UA shows features consistent with UTI.  Since LFTs were elevated sonogram of the right upper quadrant was done which was unremarkable.  CT head and CT abdomen are pending.  Patient was started on empiric antibiotics and fluids and admitted for acute encephalopathy with edema and possible developing sepsis.  Flu panel is negative.     Assessment & Plan:   Principal Problem:   Acute encephalopathy Active Problems:   Sepsis (HCC)   Acute lower UTI   CAD (coronary artery disease)   Cardiomyopathy (HCC)   Essential hypertension   Pressure injury of skin   Acute cystitis without hematuria   Paroxysmal A-fib (HCC): Hx of per hospitalization of 08/02/2017--[facility]   CVA (cerebral vascular accident) [facility](HCC): Hx of CVA  #1 acute encephalopathy Questionable etiology.  May be secondary to sepsis secondary to probable acute UTI.  Blood cultures have been ordered in 1 out of 4 bottles with staph species which are coagulase-negative and felt likely to be a contaminant.  Patient more alert and following commands.  Likely getting close to baseline.  Patient with underlying dementia and unknown baseline.  Urine cultures with multiple species  present.  Patient currently afebrile.  Labs pending.  Saline lock IV fluids.  IV Zosyn has been changed to oral Augmentin to complete course of antibiotic treatment.   2.  Sepsis likely secondary to probable UTI Questionable etiology.  Concern that sepsis likely secondary to UTI.  Blood cultures with 1/4 bottles with coagulase-negative staph felt likely to be a contaminant.  Urine cultures with multiple species.  Urinalysis however had large leukocytes too numerous to count WBCs.  Lactic acid levels trended down.  Patient afebrile.  Changed IV Zosyn to oral Augmentin to complete course of antibiotic treatment.  IV vancomycin was discontinued on 09/19/2017.    3.  Borderline blood pressure Patient with systolic blood pressures in the 90-100s over the past 2-3 days.  Blood pressure improving..  Concern for sepsis.  Patient has been pancultured.  Urine cultures with multiple species.  Blood cultures pending with 1/4 bottles with coagulase-negative staph.  Continue gentle hydration with supportive care.  Continue current antibiotics. Follow.  4.  Hypertension Continue to hold antihypertensive medication secondary to problem #3.  5.  Acute renal failure on chronic kidney disease stage III Felt secondary to prerenal azotemia secondary to poor oral intake.  CT abdomen and pelvis negative for hydronephrosis.  Renal function improved.  Saline lock IV fluids.   6.  Normocytic anemia/iron deficiency anemia H&H stable.  No overt bleeding.  Follow.   7.  History of coronary artery disease status post multiple stents Currently stable.  Patient denies any chest pain.  Patient's cardiology office visit 07/24/2017 patient noted to  be on aspirin and Plavix at that time.  On patient's home med rec Eliquis is noted.  Review of care everywhere records it is noted that patient started on Eliquis secondary to paroxysmal atrial fibrillation and a prior history of CVA per MRI obtained during prior hospitalizations. Outpatient  follow-up with cardiology.  8.  Elevated LFTs Questionable etiology.  May be secondary to developing sepsis.  LFTs trending down.  Right upper quadrant ultrasound negative for acute cholecystitis.  CT abdomen and pelvis negative for bowel obstruction or bowel wall thickening.  Small left greater than right pleural effusions with bibasilar atelectasis or minimal infiltrate.  Fat-containing ventral hernias.  Diffuse subcutaneous edema with fluid in the left greater than right flank.  Holding statin.  Follow.  9.  Hypokalemia/hypomagnesemia Repleted.   10.  Dementia Patient with noted agitation 3 nights ago seem to be improving after starting patient on Seroquel 3 times daily.  Per RN patient very sleepy throughout the day and as such Seroquel was changed to nightly.  Per RN patient did not sleep much last night and as such we will increase Seroquel to 25 mg nightly.  Continue Zyprexa.  Will need outpatient follow-up with neurology.  11.  Gastroesophageal reflux disease Continue PPI.  #12  Hx paroxysmal atrial fibrillation/prior history of CVA --per hospitalization 08/02/2017 at [facility] Patient noted on admission to be on Eliquis.  Care everywhere during the hospitalization at [facility] 08/02/2017 it is noted that patient does have a history of paroxysmal atrial fibrillation. CHA2DS2VASC score of 7.  During that hospitalization patient noted to be started on Eliquis for stroke prophylaxis, and patient's aspirin and Plavix discontinued.  It is noted that patient was tried on Cardizem which was discontinued due to hypotension.  Patient with tachycardia in the low 100s.  Monitor blood pressure closely and start on low-dose immediate release Cardizem of 30 mg p.o. every 8 for rate control.  Patient will need to follow-up with cardiology in the outpatient setting.   13.  Diabetes mellitus CBGs have ranged from 128 -154.  Continue sliding scale insulin.  14.  Left supracondylar femur  fracture Patient being followed by Dr. Ilda Basset orthopedics in [facility] and was last seen on 09/10/2017.  Per orthopedics note patient okay for full weightbearing as tolerated.  PT/OT.  Patient with no overt bleeding and as such resumed Eliquis for possible DVT prophylaxis.  15.  Hypokalemia Repleted.  16.  Dysphasia Patient has been seen by speech therapy and started on a dysphagia 1 diet with nectar thick liquids.  Will need outpatient follow-up with speech therapy.  17.  Pressure injury stage I/II sacrum Continue current wound care.  DVT prophylaxis: Eliquis Code Status: Full Family Communication: Updated patient and daughter at bedside. Disposition Plan: Transfer to telemetry. Home with home health versus skilled nursing facility once medically stable, improvement with sepsis, back to baseline.   Consultants:   None  Procedures:   CT head 09/18/2017  CT abdomen and pelvis 09/18/2017  Right upper quadrant ultrasound 09/17/2017  Chest x-ray 09/17/2017  Antimicrobials:   IV vancomycin 09/18/2017>>>>> 09/19/2017  IV Zosyn 09/18/2017>>>>>>> 09/20/2017  Augmentin 09/20/2017   Subjective: Patient alert.  Patient states she just woke up.  Per RN patient was up most of the night.  Per daughter patient has usually stayed up at night in the past.  Patient denies any chest pain.  No shortness of breath.  Daughter at bedside and tearful.    Objective: Vitals:  09/21/17 0000 09/21/17 0200 09/21/17 0402 09/21/17 0735  BP: 101/62 110/80    Pulse: (!) 119 (!) 113    Resp: 17 17    Temp:   98.5 F (36.9 C) 99.2 F (37.3 C)  TempSrc:   Axillary Axillary  SpO2: 98% 97%    Weight:      Height:        Intake/Output Summary (Last 24 hours) at 09/21/2017 1009 Last data filed at 09/21/2017 0527 Gross per 24 hour  Intake 1684.91 ml  Output 100 ml  Net 1584.91 ml   Filed Weights   09/17/17 2225 09/18/17 2007  Weight: 78 kg (172 lb) 86.1 kg (189 lb 13.1 oz)    Examination:  General  exam: Alert. Respiratory system: Clear to auscultation anterior lung fields.  No wheezes, no crackles, no rhonchi.   Cardiovascular system: Tachycardia.  No murmurs or rubs or gallops.  No lower extremity edema.  No JVD.  Gastrointestinal system: Abdomen is nontender, nondistended, soft, positive bowel sounds.  No rebound.  No guarding.   Central nervous system: Patient alert to self.  Moving extremities spontaneously.  Extremities: Symmetric 5 x 5 power. Skin: No rashes, lesions or ulcers Psychiatry: Judgement and insight appear fair. Mood & affect appropriate.     Data Reviewed: I have personally reviewed following labs and imaging studies  CBC: Recent Labs  Lab 09/17/17 2126 09/18/17 0857 09/19/17 1032 09/20/17 0900 09/21/17 0334  WBC 10.4 8.2 8.2 7.1 5.9  NEUTROABS 8.0* 5.6  --  5.1  --   HGB 11.5* 10.8* 11.1* 11.2* 10.8*  HCT 35.1* 34.1* 34.4* 35.9* 33.8*  MCV 84.2 85.9 83.3 85.3 84.5  PLT 245 212 211 169 153   Basic Metabolic Panel: Recent Labs  Lab 09/17/17 2126 09/18/17 0857 09/19/17 1032 09/20/17 1137 09/21/17 0334  NA 138 137 139 138 141  K 4.8 3.3* 3.0* 3.9 3.8  CL 97* 100* 102 105 106  CO2 26 23 26  21* 22  GLUCOSE 128* 144* 108* 168* 136*  BUN 20 20 17 15 13   CREATININE 1.48* 1.34* 1.05* 0.96 0.87  CALCIUM 8.3* 8.2* 8.3* 8.3* 8.3*  MG  --  1.8 2.5* 2.2  --    GFR: Estimated Creatinine Clearance: 60.8 mL/min (by C-G formula based on SCr of 0.87 mg/dL). Liver Function Tests: Recent Labs  Lab 09/17/17 2126 09/18/17 0857 09/19/17 1032 09/20/17 1137  AST 133* 133* 81* 55*  ALT 42 42 35 32  ALKPHOS 151* 146* 142* 137*  BILITOT 1.9* 1.5* 1.6* 1.4*  PROT 5.9* 5.9* 6.0* 5.9*  ALBUMIN 2.4* 2.3* 2.4* 2.3*   No results for input(s): LIPASE, AMYLASE in the last 168 hours. Recent Labs  Lab 09/18/17 0857  AMMONIA 24   Coagulation Profile: No results for input(s): INR, PROTIME in the last 168 hours. Cardiac Enzymes: No results for input(s): CKTOTAL,  CKMB, CKMBINDEX, TROPONINI in the last 168 hours. BNP (last 3 results) No results for input(s): PROBNP in the last 8760 hours. HbA1C: No results for input(s): HGBA1C in the last 72 hours. CBG: Recent Labs  Lab 09/20/17 0740 09/20/17 1155 09/20/17 1539 09/20/17 2156 09/21/17 0739  GLUCAP 181* 137* 144* 128* 154*   Lipid Profile: No results for input(s): CHOL, HDL, LDLCALC, TRIG, CHOLHDL, LDLDIRECT in the last 72 hours. Thyroid Function Tests: No results for input(s): TSH, T4TOTAL, FREET4, T3FREE, THYROIDAB in the last 72 hours. Anemia Panel: No results for input(s): VITAMINB12, FOLATE, FERRITIN, TIBC, IRON, RETICCTPCT in the last 72 hours.  Sepsis Labs: Recent Labs  Lab 09/17/17 2130 09/17/17 2245 09/18/17 0857  PROCALCITON  --   --  0.13  LATICACIDVEN 2.71* 2.20* 1.8    Recent Results (from the past 240 hour(s))  Blood Culture (routine x 2)     Status: None (Preliminary result)   Collection Time: 09/17/17  9:26 PM  Result Value Ref Range Status   Specimen Description   Final    BLOOD RIGHT FOREARM Performed at [facility], [address], [place], [place] [address]    Special Requests   Final    BOTTLES DRAWN AEROBIC AND ANAEROBIC Blood Culture adequate volume Performed at [facility], [address] [place], [place] [address]    Culture   Final    NO GROWTH 3 DAYS Performed at [facility], [address] [place], [place] [address]    Report Status PENDING  Incomplete  Blood Culture (routine x 2)     Status: Abnormal   Collection Time: 09/17/17  9:26 PM  Result Value Ref Range Status   Specimen Description   Final    BLOOD RIGHT ARM Performed at [facility], [address], [place], [place] [address]    Special Requests   Final    BOTTLES DRAWN AEROBIC AND ANAEROBIC Blood Culture adequate volume Performed at [facility], [address] [place], [place] [address]     Culture  Setup Time   Final    GRAM POSITIVE COCCI AEROBIC BOTTLE ONLY CRITICAL RESULT CALLED TO, READ BACK BY AND VERIFIED WITH: B GREEN PHARMD 09/19/17 0240 JDW    Culture (A)  Final    STAPHYLOCOCCUS SPECIES (COAGULASE NEGATIVE) THE SIGNIFICANCE OF ISOLATING THIS ORGANISM FROM A SINGLE SET OF BLOOD CULTURES WHEN MULTIPLE SETS ARE DRAWN IS UNCERTAIN. PLEASE NOTIFY THE MICROBIOLOGY DEPARTMENT WITHIN ONE WEEK IF SPECIATION AND SENSITIVITIES ARE REQUIRED. Performed at [facility], [address] [place], [place] [address]    Report Status 09/20/2017 FINAL  Final  Urine culture     Status: Abnormal   Collection Time: 09/17/17  9:26 PM  Result Value Ref Range Status   Specimen Description   Final    URINE, CATHETERIZED Performed at [facility], [address], [place], [place] [address]    Special Requests   Final    NONE Performed at [facility], [address], [place], [place] [address]    Culture MULTIPLE SPECIES PRESENT, SUGGEST RECOLLECTION (A)  Final   Report Status 09/19/2017 FINAL  Final  Blood Culture ID Panel (Reflexed)     Status: Abnormal   Collection Time: 09/17/17  9:26 PM  Result Value Ref Range Status   Enterococcus species NOT DETECTED NOT DETECTED Final   Listeria monocytogenes NOT DETECTED NOT DETECTED Final   Staphylococcus species DETECTED (A) NOT DETECTED Final    Comment: Methicillin (oxacillin) resistant coagulase negative staphylococcus. Possible blood culture contaminant (unless isolated from more than one blood culture draw or clinical case suggests pathogenicity). No antibiotic treatment is indicated for blood  culture contaminants. CRITICAL RESULT CALLED TO, READ BACK BY AND VERIFIED WITH: B GREEN PHARMD 09/19/17 0240 JDW    Staphylococcus aureus NOT DETECTED NOT DETECTED Final   Methicillin resistance DETECTED (A) NOT DETECTED Final    Comment: CRITICAL RESULT CALLED TO, READ BACK BY AND VERIFIED WITH: B  GREEN PHARMD 09/19/17 0240 JDW    Streptococcus species NOT DETECTED NOT DETECTED Final  Streptococcus agalactiae NOT DETECTED NOT DETECTED Final   Streptococcus pneumoniae NOT DETECTED NOT DETECTED Final   Streptococcus pyogenes NOT DETECTED NOT DETECTED Final   Acinetobacter baumannii NOT DETECTED NOT DETECTED Final   Enterobacteriaceae species NOT DETECTED NOT DETECTED Final   Enterobacter cloacae complex NOT DETECTED NOT DETECTED Final   Escherichia coli NOT DETECTED NOT DETECTED Final   Klebsiella oxytoca NOT DETECTED NOT DETECTED Final   Klebsiella pneumoniae NOT DETECTED NOT DETECTED Final   Proteus species NOT DETECTED NOT DETECTED Final   Serratia marcescens NOT DETECTED NOT DETECTED Final   Haemophilus influenzae NOT DETECTED NOT DETECTED Final   Neisseria meningitidis NOT DETECTED NOT DETECTED Final   Pseudomonas aeruginosa NOT DETECTED NOT DETECTED Final   Candida albicans NOT DETECTED NOT DETECTED Final   Candida glabrata NOT DETECTED NOT DETECTED Final   Candida krusei NOT DETECTED NOT DETECTED Final   Candida parapsilosis NOT DETECTED NOT DETECTED Final   Candida tropicalis NOT DETECTED NOT DETECTED Final    Comment: Performed at [facility], [address] [place], [place] [address]  MRSA PCR Screening     Status: Abnormal   Collection Time: 09/18/17  9:08 PM  Result Value Ref Range Status   MRSA by PCR POSITIVE (A) NEGATIVE Final    Comment:        The GeneXpert MRSA Assay (FDA approved for NASAL specimens only), is one component of a comprehensive MRSA colonization surveillance program. It is not intended to diagnose MRSA infection nor to guide or monitor treatment for MRSA infections. RESULT CALLED TO, READ BACK BY AND VERIFIED WITH: Stephanie Acre [id number] @ 2330 BY J SCOTTON Performed at [facility], [address], [place], [place] [address]          Radiology Studies: Dg Chest Port 1 View  Result Date:  09/20/2017 CLINICAL DATA:  Encephalopathy EXAM: PORTABLE CHEST 1 VIEW COMPARISON:  09/17/2017 FINDINGS: Progression of bilateral airspace disease with perihilar component suggesting heart failure. Progression of bibasilar atelectasis and small effusions. Cardiac enlargement. Left coronary stents. IMPRESSION: Progressive heart failure with edema. Progressive bibasilar atelectasis and small effusions. Electronically Signed   By: Marlan Palau M.D.   On: 09/20/2017 09:59        Scheduled Meds: . amoxicillin-clavulanate  1 tablet Oral Q12H  . apixaban  5 mg Oral BID  . Chlorhexidine Gluconate Cloth  6 each Topical Q0600  . dorzolamide  1 drop Both Eyes TID  . insulin aspart  0-9 Units Subcutaneous TID WC  . mupirocin ointment  1 application Nasal BID  . OLANZapine  5 mg Oral Daily  . pantoprazole  40 mg Oral Daily  . QUEtiapine  12.5 mg Oral QHS   Continuous Infusions:    LOS: 4 days    Time spent: 35 mins    Ramiro Harvest, MD [facility] Pager [phone]  If 7PM-7AM, please contact night-coverage [email or web address] Password [email or web address] 09/21/2017, 10:09 AM

## 2017-09-21 NOTE — Progress Notes (Signed)
Report called and given to [place], rn on 4th floor. Pt transferred to unit . Derinda SisVera Lulamae Skorupski,rn.

## 2017-09-22 DIAGNOSIS — L89152 Pressure ulcer of sacral region, stage 2: Secondary | ICD-10-CM

## 2017-09-22 LAB — CULTURE, BLOOD (ROUTINE X 2)
CULTURE: NO GROWTH
Special Requests: ADEQUATE

## 2017-09-22 LAB — BASIC METABOLIC PANEL
ANION GAP: 10 (ref 5–15)
BUN: 13 mg/dL (ref 6–20)
CALCIUM: 8.4 mg/dL — AB (ref 8.9–10.3)
CO2: 23 mmol/L (ref 22–32)
Chloride: 107 mmol/L (ref 101–111)
Creatinine, Ser: 0.83 mg/dL (ref 0.44–1.00)
Glucose, Bld: 153 mg/dL — ABNORMAL HIGH (ref 65–99)
Potassium: 3.4 mmol/L — ABNORMAL LOW (ref 3.5–5.1)
Sodium: 140 mmol/L (ref 135–145)

## 2017-09-22 LAB — CBC
HCT: 37.4 % (ref 36.0–46.0)
HEMOGLOBIN: 11.6 g/dL — AB (ref 12.0–15.0)
MCH: 27.2 pg (ref 26.0–34.0)
MCHC: 31 g/dL (ref 30.0–36.0)
MCV: 87.8 fL (ref 78.0–100.0)
Platelets: 164 10*3/uL (ref 150–400)
RBC: 4.26 MIL/uL (ref 3.87–5.11)
RDW: 20.8 % — ABNORMAL HIGH (ref 11.5–15.5)
WBC: 6.5 10*3/uL (ref 4.0–10.5)

## 2017-09-22 LAB — GLUCOSE, CAPILLARY
GLUCOSE-CAPILLARY: 128 mg/dL — AB (ref 65–99)
GLUCOSE-CAPILLARY: 120 mg/dL — AB (ref 65–99)
GLUCOSE-CAPILLARY: 149 mg/dL — AB (ref 65–99)
Glucose-Capillary: 108 mg/dL — ABNORMAL HIGH (ref 65–99)

## 2017-09-22 MED ORDER — QUETIAPINE FUMARATE 25 MG PO TABS
12.5000 mg | ORAL_TABLET | Freq: Every day | ORAL | Status: DC
  Administered 2017-09-22: 12.5 mg via ORAL
  Filled 2017-09-22: qty 1

## 2017-09-22 MED ORDER — POTASSIUM CHLORIDE CRYS ER 20 MEQ PO TBCR
40.0000 meq | EXTENDED_RELEASE_TABLET | Freq: Once | ORAL | Status: DC
  Filled 2017-09-22 (×2): qty 2

## 2017-09-22 MED ORDER — TRAZODONE HCL 50 MG PO TABS
50.0000 mg | ORAL_TABLET | Freq: Once | ORAL | Status: AC
  Administered 2017-09-22: 50 mg via ORAL
  Filled 2017-09-22: qty 1

## 2017-09-22 NOTE — Progress Notes (Signed)
Unable to get patient fully awakened to take any of her scheduled medications, MD has been made aware

## 2017-09-22 NOTE — Clinical Social Work Note (Signed)
Clinical Social Work Assessment  Patient Details  Name: Stacy ShorterJanice Dorsey MRN: [id number] Date of Birth: 27-Jul-1945  Date of referral:  09/22/17               Reason for consult:  Facility Placement(consult states admitted from facility however per daughters pt was admitted from home)                Permission sought to share information with:    Permission granted to share information::     Name::     daughters Moshe CiproBritt and [name]  Agency::     Relationship::     Contact Information:     Housing/Transportation Living arrangements for the past 2 months:  Single Family Home(however has been mostly in SNFs or in hospital since 05/2017 per daughters) Source of Information:  Adult Children Patient Interpreter Needed:  None Criminal Activity/Legal Involvement Pertinent to Current Situation/Hospitalization:  No - Comment as needed Significant Relationships:  Adult Children, Other Family Members Lives with:  Adult Children(and grandchildren) Do you feel safe going back to the place where you live?  Yes Need for family participation in patient care:  Yes (Comment)(daughter is primary caretaker)  Care giving concerns:  Pt admitted from home where she resides with her daughter and granddaughter. Had only been there about 5 days per daughter, prior to that had been in rehab at [facility] for about a month. Prior to that "was in [facility] for about 2 weeks." Prior to that was [facility] for [facility] femur. Daughters state, "She was living at home alone, driving, doing everything herself until she fell and fractured her femur in November 2018. Since then she has been in the hospital 3 times and in rehab twice. She gets confused and disoriented everytime she has a UTI, has had 7 in the past 4 months, and she is setback from all the progress she made." Daughter state that pt was set up with [facility] RN/PT/OT upon  DC home from [facility] last week.  Pt's daughter, son-in-law, and granddaughter assist with her care at home. Long term goal is to have pt be able to return to her own home if family can coordinate a caretaker being there with her 24/7.    Social Worker assessment / plan:  CSW consulted to assist with disposition.  Pt admitted from home however was home less than a week following DC from SNF ([facility]). Discussed care needs above with pt's daughters at bedside as pt was soundy sleeping. Pt's daughter reports that pt has used all Medicare coverage days for SNF and owes $1000 to [facility] as pt was in copay days by the time she was discharged. Explains pt has Medicaid as well as Medicare, however not special-assistance Medicaid (facility medicaid), therefore pt also "had to pay them her social security check for the month." Daughters state that pt and family has been very unhappy with pt's care at multiple SNFs "and we promised her we would take her home this time instead of back to a SNF. Honestly she just has not way of affording it anymore." Pt's daughter states she plans to have pt come home (notes that they are trying to build a ramp into the home to assist with transporting pt) with multiple family members assisting with her care. Also want to continue with [facility].   Plan: to return to daughter's home at DC.  Notes she may need PTAR at DC for safe transport. Wants to continue [facility].   Employment status:  Retired Health and safety inspector:  Armed forces operational officer, Medicaid In [place] PT Recommendations:  Skilled Nursing Facility Information / Referral to community resources:     Patient/Family's Response to care:  Daughter state "we love [facility], but we want to make sure this infection goes away. We feel like something is wrong to make her keep having these infections.   Patient/Family's Understanding of and Emotional Response to Diagnosis, Current  Treatment, and Prognosis:  Pt unable to demonstrate understanding- sleeping, and has not been oriented to situation per daughters (this is not her baseline the report) Daughters demonstrate good understanding of treatment and plan. Are reasonable in their expectations for pt's care needs at home. Ask pertinent questions. "We want to honor her wish to go home. Rehab has not worked for her either so we will give her the chance to try at home."  Emotional Assessment Appearance:  Appears stated age Attitude/Demeanor/Rapport:  (sleeping) Affect (typically observed):  Calm Orientation:  (UTA- pt sleeping, has been oriented to person per chart) Alcohol / Substance use:  Not Applicable Psych involvement (Current and /or in the community):  No (Comment)  Discharge Needs  Concerns to be addressed:  Care Coordination, Discharge Planning Concerns Readmission within the last 30 days:    Current discharge risk:  None Barriers to Discharge:  Continued Medical Work up   [employer], LCSW 09/22/2017, 4:32 PM  [phone]

## 2017-09-22 NOTE — Progress Notes (Signed)
PROGRESS NOTE    Stacy Dorsey  UEA:[id number] DOB: 03-Jun-1946 DOA: 09/17/2017 PCP: Patient, No Pcp Per    Brief Narrative:  Stacy Dorsey is a 71 y.o. female with history of cardiomyopathy, diabetes mellitus, hypertension was recently discharged from rehab 4 days ago after being treated for left femur fracture was brought to the ER because of increasing confusion.  Since her discharge from the rehab patient had become more delirious not eating well with fever and chills.  Patient has had recurrent episodes of delirium during hospitalization and also patient has urinary tract infection as per the report.    ED Course: In the ER patient is tachycardic fever of 101.  Patient's chest x-ray was unremarkable.  Lactate levels were elevated and had leukocytosis.  UA shows features consistent with UTI.  Since LFTs were elevated sonogram of the right upper quadrant was done which was unremarkable.  CT head and CT abdomen are pending.  Patient was started on empiric antibiotics and fluids and admitted for acute encephalopathy with edema and possible developing sepsis.  Flu panel is negative.     Assessment & Plan:   Principal Problem:   Acute encephalopathy Active Problems:   Sepsis (HCC)   Acute lower UTI   CAD (coronary artery disease)   Cardiomyopathy (HCC)   Essential hypertension   Pressure injury of skin   Acute cystitis without hematuria   Paroxysmal A-fib (HCC): Hx of per hospitalization of 08/02/2017--[facility]   CVA (cerebral vascular accident) [facility]): Hx of CVA  #1 acute encephalopathy Questionable etiology.  May be secondary to sepsis secondary to probable acute UTI of underlying dementia.  Blood cultures have been ordered in 1 out of 4 bottles with staph species which are coagulase-negative and felt likely to be a contaminant.  Patient more alert and following commands.  Likely getting close to baseline.  Patient with underlying dementia and unknown baseline.  Urine cultures  with multiple species present.  Patient currently afebrile. Saline lock IV fluids.  IV Zosyn has been changed to oral Augmentin to complete course of antibiotic treatment.   2.  Sepsis likely secondary to probable UTI Questionable etiology.  Concern that sepsis likely secondary to UTI.  Blood cultures with 1/4 bottles with coagulase-negative staph felt likely to be a contaminant.  Urine cultures with multiple species.  Urinalysis however had large leukocytes too numerous to count WBCs.  Lactic acid levels trended down.  Patient afebrile.  Changed IV Zosyn to oral Augmentin to complete course of antibiotic treatment.  IV vancomycin was discontinued on 09/19/2017.    3.  Borderline blood pressure Patient with systolic blood pressures in the 90-100s over the past 3-4 days.  Blood pressure improving..  Concern for sepsis.  Patient has been pancultured.  Urine cultures with multiple species.  Blood cultures pending with 1/4 bottles with coagulase-negative staph.  IV fluids have been saline locked.  Monitor closely with low-dose oral Cardizem which has been started. Continue current antibiotics. Follow.  4.  Hypertension Now on low-dose oral Cardizem for rate control.  Monitor blood pressure.   5.  Acute renal failure on chronic kidney disease stage III Felt secondary to prerenal azotemia secondary to poor oral intake.  CT abdomen and pelvis negative for hydronephrosis.  Renal function improved.  Saline lock IV fluids.   6.  Normocytic anemia/iron deficiency anemia Stable.  Outpatient follow-up.   7.  History of coronary artery disease status post multiple stents Currently stable.  Patient denies any chest pain.  Patient's cardiology office visit 07/24/2017 patient noted to be on aspirin and Plavix at that time.  On patient's home med rec Eliquis is noted.  Review of care everywhere records it is noted that patient started on Eliquis secondary to paroxysmal atrial fibrillation and a prior history of CVA per  MRI obtained during prior hospitalizations. Outpatient follow-up with cardiology.  8.  Elevated LFTs Questionable etiology.  May be secondary to developing sepsis.  LFTs trending down.  Right upper quadrant ultrasound negative for acute cholecystitis.  CT abdomen and pelvis negative for bowel obstruction or bowel wall thickening.  Small left greater than right pleural effusions with bibasilar atelectasis or minimal infiltrate.  Fat-containing ventral hernias.  Diffuse subcutaneous edema with fluid in the left greater than right flank.  Holding statin.  Follow.  9.  Hypokalemia/hypomagnesemia Repleted.   10.  Dementia Patient with noted agitation overnight.  Patient Seroquel dose was changed to nightly.  3 nights ago seem to be improving after starting patient on Seroquel 3 times daily.  Per RN patient very sleepy throughout the day and as such Seroquel was changed to nightly.  Per RN patient did not sleep much last night and now sleeping this morning.  Continue Zyprexa.  Will need outpatient follow-up with neurology.  11.  Gastroesophageal reflux disease Continue PPI.  #12  Hx paroxysmal atrial fibrillation/prior history of CVA --per hospitalization [facility] Patient noted on admission to be on Eliquis.  Care everywhere during the hospitalization at [facility] 08/02/2017 it is noted that patient does have a history of paroxysmal atrial fibrillation. CHA2DS2VASC score of 7.  During that hospitalization patient noted to be started on Eliquis for stroke prophylaxis, and patient's aspirin and Plavix discontinued.  It is noted that patient was tried on Cardizem which was discontinued due to hypotension.  Patient with tachycardia in the low 100s.  Heart rate improved on low-dose Cardizem 30 mg p.o. every 8 hours for rate control.  Outpatient follow-up with cardiology.   13.  Diabetes mellitus CBGs have ranged from 108 -128.  Continue sliding scale insulin.  14.  Left supracondylar  femur fracture Patient being [facility] and was last seen on 09/10/2017.  Per orthopedics note patient okay for full weightbearing as tolerated.  PT/OT.  Patient with no overt bleeding and as such resumed Eliquis for possible DVT prophylaxis.  15.  Hypokalemia Repleted.  16.  Dysphagia Patient has been seen by speech therapy and started on a dysphagia 1 diet with nectar thick liquids.  Outpatient follow-up with speech therapy.   17.  Pressure injury stage I/II sacrum Continue current wound care.  DVT prophylaxis: Eliquis Code Status: Full Family Communication: No family at bedside.  Disposition Plan: Home with home health versus skilled nursing facility once medically stable, improvement with sepsis, back to baseline.  Hopefully in the next 24 hours.   Consultants:   None  Procedures:   CT head 09/18/2017  CT abdomen and pelvis 09/18/2017  Right upper quadrant ultrasound 09/17/2017  Chest x-ray 09/17/2017  Antimicrobials:   IV vancomycin 09/18/2017>>>>> 09/19/2017  IV Zosyn 09/18/2017>>>>>>> 09/20/2017  Augmentin 09/20/2017   Subjective: Per nursing patient was up all night agitated pulling out IVs.  Patient now asleep heavily.  Patient alert.   Objective: Vitals:   09/21/17 1346 09/21/17 1400 09/21/17 2121 09/22/17 0601  BP: 123/61 126/66 106/86 100/76  Pulse: (!) 116 (!) 109  67  Resp: (!) 28 18 18  18  Temp:   98.5 F (36.9 C) 98.7 F (37.1 C)  TempSrc:   Oral Oral  SpO2:  95% 98% 98%  Weight:      Height:        Intake/Output Summary (Last 24 hours) at 09/22/2017 1149 Last data filed at 09/22/2017 0930 Gross per 24 hour  Intake 240 ml  Output -  Net 240 ml   Filed Weights   09/17/17 2225 09/18/17 2007  Weight: 78 kg (172 lb) 86.1 kg (189 lb 13.1 oz)    Examination:  General exam: Asleep. Respiratory system: Clear to auscultation anterior lung fields.  No wheezes, no crackles, no rhonchi.  Mouth  breathing. Cardiovascular system: Regular rate rhythm no murmurs rubs or gallops.  No lower extremity edema.  No JVD.  Gastrointestinal system: Abdomen is soft, nontender, nondistended, positive bowel sounds.  Central nervous system: Patient sleeping.   Moving extremities spontaneously.  Extremities: Symmetric 5 x 5 power. Skin: No rashes, lesions or ulcers Psychiatry: Judgement and insight appear fair. Mood & affect appropriate.     Data Reviewed: I have personally reviewed following labs and imaging studies  CBC: Recent Labs  Lab 09/17/17 2126 09/18/17 0857 09/19/17 1032 09/20/17 0900 09/21/17 0334 09/22/17 0801  WBC 10.4 8.2 8.2 7.1 5.9 6.5  NEUTROABS 8.0* 5.6  --  5.1  --   --   HGB 11.5* 10.8* 11.1* 11.2* 10.8* 11.6*  HCT 35.1* 34.1* 34.4* 35.9* 33.8* 37.4  MCV 84.2 85.9 83.3 85.3 84.5 87.8  PLT 245 212 211 169 153 164   Basic Metabolic Panel: Recent Labs  Lab 09/18/17 0857 09/19/17 1032 09/20/17 1137 09/21/17 0334 09/22/17 0518  NA 137 139 138 141 140  K 3.3* 3.0* 3.9 3.8 3.4*  CL 100* 102 105 106 107  CO2 23 26 21* 22 23  GLUCOSE 144* 108* 168* 136* 153*  BUN 20 17 15 13 13   CREATININE 1.34* 1.05* 0.96 0.87 0.83  CALCIUM 8.2* 8.3* 8.3* 8.3* 8.4*  MG 1.8 2.5* 2.2  --   --    GFR: Estimated Creatinine Clearance: 63.7 mL/min (by C-G formula based on SCr of 0.83 mg/dL). Liver Function Tests: Recent Labs  Lab 09/17/17 2126 09/18/17 0857 09/19/17 1032 09/20/17 1137  AST 133* 133* 81* 55*  ALT 42 42 35 32  ALKPHOS 151* 146* 142* 137*  BILITOT 1.9* 1.5* 1.6* 1.4*  PROT 5.9* 5.9* 6.0* 5.9*  ALBUMIN 2.4* 2.3* 2.4* 2.3*   No results for input(s): LIPASE, AMYLASE in the last 168 hours. Recent Labs  Lab 09/18/17 0857  AMMONIA 24   Coagulation Profile: No results for input(s): INR, PROTIME in the last 168 hours. Cardiac Enzymes: No results for input(s): CKTOTAL, CKMB, CKMBINDEX, TROPONINI in the last 168 hours. BNP (last 3 results) No results for  input(s): PROBNP in the last 8760 hours. HbA1C: No results for input(s): HGBA1C in the last 72 hours. CBG: Recent Labs  Lab 09/21/17 1124 09/21/17 1908 09/21/17 2110 09/22/17 0815 09/22/17 1116  GLUCAP 153* 164* 147* 128* 120*   Lipid Profile: No results for input(s): CHOL, HDL, LDLCALC, TRIG, CHOLHDL, LDLDIRECT in the last 72 hours. Thyroid Function Tests: No results for input(s): TSH, T4TOTAL, FREET4, T3FREE, THYROIDAB in the last 72 hours. Anemia Panel: No results for input(s): VITAMINB12, FOLATE, FERRITIN, TIBC, IRON, RETICCTPCT in the last 72 hours. Sepsis Labs: Recent Labs  Lab 09/17/17 2130 09/17/17 2245 09/18/17 0857  PROCALCITON  --   --  0.13  LATICACIDVEN 2.71* 2.20*  1.8    Recent Results (from the past 240 hour(s))  Blood Culture (routine x 2)     Status: None (Preliminary result)   Collection Time: 09/17/17  9:26 PM  Result Value Ref Range Status   Specimen Description   Final    BLOOD RIGHT FOREARM Performed at [facility], [address], [place], [place] [address]    Special Requests   Final    BOTTLES DRAWN AEROBIC AND ANAEROBIC Blood Culture adequate volume Performed at [facility], [address], [place], [place] [address]    Culture   Final    NO GROWTH 4 DAYS Performed at [facility], [address], [place], [place] [address]    Report Status PENDING  Incomplete  Blood Culture (routine x 2)     Status: Abnormal   Collection Time: 09/17/17  9:26 PM  Result Value Ref Range Status   Specimen Description   Final    BLOOD RIGHT ARM Performed at [facility], [address], [place], [place] [address]    Special Requests   Final    BOTTLES DRAWN AEROBIC AND ANAEROBIC Blood Culture adequate volume Performed at [facility], [address], [place], [place] [address]    Culture  Setup Time   Final    GRAM POSITIVE COCCI AEROBIC BOTTLE ONLY CRITICAL RESULT  CALLED TO, READ BACK BY AND VERIFIED WITH: B GREEN PHARMD 09/19/17 0240 JDW    Culture (A)  Final    STAPHYLOCOCCUS SPECIES (COAGULASE NEGATIVE) THE SIGNIFICANCE OF ISOLATING THIS ORGANISM FROM A SINGLE SET OF BLOOD CULTURES WHEN MULTIPLE SETS ARE DRAWN IS UNCERTAIN. PLEASE NOTIFY THE MICROBIOLOGY DEPARTMENT WITHIN ONE WEEK IF SPECIATION AND SENSITIVITIES ARE REQUIRED. Performed at [facility], [address] [place], [place] [address]    Report Status 09/20/2017 FINAL  Final  Urine culture     Status: Abnormal   Collection Time: 09/17/17  9:26 PM  Result Value Ref Range Status   Specimen Description   Final    URINE, CATHETERIZED Performed at [facility], [address] [place], [place] [address]    Special Requests   Final    NONE Performed at [facility], [address], [place], [place] [address]    Culture MULTIPLE SPECIES PRESENT, SUGGEST RECOLLECTION (A)  Final   Report Status 09/19/2017 FINAL  Final  Blood Culture ID Panel (Reflexed)     Status: Abnormal   Collection Time: 09/17/17  9:26 PM  Result Value Ref Range Status   Enterococcus species NOT DETECTED NOT DETECTED Final   Listeria monocytogenes NOT DETECTED NOT DETECTED Final   Staphylococcus species DETECTED (A) NOT DETECTED Final    Comment: Methicillin (oxacillin) resistant coagulase negative staphylococcus. Possible blood culture contaminant (unless isolated from more than one blood culture draw or clinical case suggests pathogenicity). No antibiotic treatment is indicated for blood  culture contaminants. CRITICAL RESULT CALLED TO, READ BACK BY AND VERIFIED WITH: B GREEN PHARMD 09/19/17 0240 JDW    Staphylococcus aureus NOT DETECTED NOT DETECTED Final   Methicillin resistance DETECTED (A) NOT DETECTED Final    Comment: CRITICAL RESULT CALLED TO, READ BACK BY AND VERIFIED WITH: B GREEN PHARMD 09/19/17 0240 JDW    Streptococcus species NOT DETECTED NOT DETECTED Final    Streptococcus agalactiae NOT DETECTED NOT DETECTED Final   Streptococcus pneumoniae NOT DETECTED NOT DETECTED Final   Streptococcus pyogenes NOT DETECTED NOT DETECTED  Final   Acinetobacter baumannii NOT DETECTED NOT DETECTED Final   Enterobacteriaceae species NOT DETECTED NOT DETECTED Final   Enterobacter cloacae complex NOT DETECTED NOT DETECTED Final   Escherichia coli NOT DETECTED NOT DETECTED Final   Klebsiella oxytoca NOT DETECTED NOT DETECTED Final   Klebsiella pneumoniae NOT DETECTED NOT DETECTED Final   Proteus species NOT DETECTED NOT DETECTED Final   Serratia marcescens NOT DETECTED NOT DETECTED Final   Haemophilus influenzae NOT DETECTED NOT DETECTED Final   Neisseria meningitidis NOT DETECTED NOT DETECTED Final   Pseudomonas aeruginosa NOT DETECTED NOT DETECTED Final   Candida albicans NOT DETECTED NOT DETECTED Final   Candida glabrata NOT DETECTED NOT DETECTED Final   Candida krusei NOT DETECTED NOT DETECTED Final   Candida parapsilosis NOT DETECTED NOT DETECTED Final   Candida tropicalis NOT DETECTED NOT DETECTED Final    Comment: Performed at [facility], [address], [place], [place] [address]  MRSA PCR Screening     Status: Abnormal   Collection Time: 09/18/17  9:08 PM  Result Value Ref Range Status   MRSA by PCR POSITIVE (A) NEGATIVE Final    Comment:        The GeneXpert MRSA Assay (FDA approved for NASAL specimens only), is one component of a comprehensive MRSA colonization surveillance program. It is not intended to diagnose MRSA infection nor to guide or monitor treatment for MRSA infections. RESULT CALLED TO, READ BACK BY AND VERIFIED WITH: Stephanie Acre [id number] @ 2330 BY J SCOTTON Performed at [facility], [address], [place], [place] [address]          Radiology Studies: No results found.      Scheduled Meds: . amoxicillin-clavulanate  1 tablet Oral Q12H  . apixaban  5 mg Oral BID  . Chlorhexidine  Gluconate Cloth  6 each Topical Q0600  . diltiazem  30 mg Oral Q8H  . dorzolamide  1 drop Both Eyes TID  . insulin aspart  0-9 Units Subcutaneous TID WC  . mupirocin ointment  1 application Nasal BID  . OLANZapine  5 mg Oral Daily  . pantoprazole  40 mg Oral Daily  . potassium chloride  40 mEq Oral Once  . QUEtiapine  12.5 mg Oral QHS   Continuous Infusions:    LOS: 5 days    Time spent: 35 mins    Ramiro Harvest, MD [facility] Pager [phone]  If 7PM-7AM, please contact night-coverage [email or web address] Password [facility] 09/22/2017, 11:49 AM

## 2017-09-23 LAB — BASIC METABOLIC PANEL
ANION GAP: 10 (ref 5–15)
BUN: 12 mg/dL (ref 6–20)
CO2: 22 mmol/L (ref 22–32)
Calcium: 8.6 mg/dL — ABNORMAL LOW (ref 8.9–10.3)
Chloride: 106 mmol/L (ref 101–111)
Creatinine, Ser: 0.84 mg/dL (ref 0.44–1.00)
GFR calc non Af Amer: >60 mL/min (ref >60)
GLUCOSE: 192 mg/dL — AB (ref 65–99)
Potassium: 5.3 mmol/L — ABNORMAL HIGH (ref 3.5–5.1)
Sodium: 138 mmol/L (ref 135–145)

## 2017-09-23 LAB — GLUCOSE, CAPILLARY
Glucose-Capillary: 179 mg/dL — ABNORMAL HIGH (ref 65–99)
Glucose-Capillary: 177 mg/dL — ABNORMAL HIGH (ref 65–99)
Glucose-Capillary: 185 mg/dL — ABNORMAL HIGH (ref 65–99)

## 2017-09-23 MED ORDER — AMOXICILLIN-POT CLAVULANATE 875-125 MG PO TABS: 1.0000 | ORAL_TABLET | Freq: Two times a day (BID) | ORAL | 0 refills | Status: AC

## 2017-09-23 MED ORDER — QUETIAPINE FUMARATE 25 MG PO TABS: 12.5000 mg | ORAL_TABLET | Freq: Every day | ORAL | 0 refills | Status: DC

## 2017-09-23 MED ORDER — RESOURCE THICKENUP CLEAR PO POWD: 1.0000 | ORAL | 0 refills | Status: DC | PRN

## 2017-09-23 MED ORDER — DILTIAZEM HCL 30 MG PO TABS: 30.0000 mg | ORAL_TABLET | Freq: Three times a day (TID) | ORAL | 0 refills | Status: AC

## 2017-09-23 NOTE — Discharge Summary (Signed)
Physician Discharge Summary  Stacy Dorsey ZOX:[id number] DOB: November 08, 1945 DOA: 09/17/2017  PCP: Patient, No Pcp Per  Admit date: 09/17/2017 Discharge date: 09/23/2017  Time spent: 60 minutes  Recommendations for Outpatient Follow-up:  1. Follow-up with PCP in 2 weeks.  On follow-up patient will need a basic metabolic profile done to follow-up on electrolytes and renal function.  Patient also need a CBC done to follow-up on H&H.  Patient may benefit  full referral to neurology for follow-up on dementia. 2. Follow-up with cardiology in 1-2 weeks.   Discharge Diagnoses:  Principal Problem:   Acute encephalopathy Active Problems:   Sepsis (HCC)   Acute lower UTI   CAD (coronary artery disease)   Cardiomyopathy (HCC)   Essential hypertension   Pressure injury of skin   Acute cystitis without hematuria   Paroxysmal A-fib (HCC): Hx of per hospitalization of 08/02/2017--[facility]   CVA (cerebral vascular accident) [facility]): Hx of CVA   Discharge Condition: Stable and improved  Diet recommendation: Heart healthy/dysphagia 1 diet with nectar thick liquids.  Filed Weights   09/17/17 2225 09/18/17 2007  Weight: 78 kg (172 lb) 86.1 kg (189 lb 13.1 oz)    History of present illness:  Per Dr. Charlton Haws Bondar is a 72 y.o. female with history of cardiomyopathy, diabetes mellitus, hypertension was recently discharged from rehab 4 days ago after being treated for left femur fracture was brought to the ER because of increasing confusion.  Since her discharge from the rehab patient had become more delirious not eating well with fever and chills.  Patient has had recurrent episodes of delirium during hospitalization and also patient has urinary tract infection as per the report.    ED Course: In the ER patient was tachycardic fever of 101.  Patient's chest x-ray was unremarkable.  Lactate levels were elevated and had leukocytosis.  UA showed features consistent with UTI.  Since LFTs were  elevated sonogram of the right upper quadrant was done which was unremarkable.  CT head and CT abdomen are pending.  Patient was started on empiric antibiotics and fluids and admitted for acute encephalopathy with edema and possible developing sepsis.  Flu panel was negative.    Hospital Course:  #1 acute encephalopathy Likely secondary to sepsis secondary to probable acute UTI.  Blood cultures have been ordered and 1 out of 4 bottles with staph species which are coagulase-negative and felt likely to be a contaminant.  Patient was placed empirically on IV Zosyn and monitored.  Patient improved during the hospitalization.  Patient noted to have underlying dementia.  Patient improved clinically on a daily basis and was likely close to baseline by day of discharge.  Patient was discharged home on 3 more days of oral Augmentin to complete a course of antibiotic treatment.  Outpatient follow-up.    2.  Sepsis likely secondary to probable UTI Concern that sepsis likely secondary to UTI.  Blood cultures with 1/4 bottles with coagulase-negative staph felt likely to be a contaminant.  Urine cultures with multiple species.  Urinalysis however had large leukocytes too numerous to count WBCs.  Lactic acid levels trended down.  Patient remained afebrile.    Patient initially was on IV Zosyn and subsequently transitioned to oral Augmentin.  Patient be discharged on 3 more days of oral Augmentin to complete a one-week course of antibiotic treatment.  Outpatient follow-up.    3.  Borderline blood pressure Patient with systolic blood pressures in the 90-100s  during initial part of the  hospitalization.  Blood pressure improved. Concern for sepsis.  Patient has been pancultured.  Urine cultures with multiple species.  Blood cultures pending with 1/4 bottles with coagulase-negative staph.  Patient was hydrated gently with supportive care.  Patient placed empirically on antibiotics and was discharged on 3 more days of  oral antibiotics to complete a course of antibiotic treatment.  Outpatient follow-up.  4.  Hypertension Patient's antihypertensive medications were held secondary to problem #3.  As patient improved clinically patient was started on low-dose Cardizem for A. fib.  Blood pressure remained stable.  Outpatient follow-up.   5.  Acute renal failure on chronic kidney disease stage III Felt secondary to prerenal azotemia secondary to poor oral intake.  CT abdomen and pelvis negative for hydronephrosis.  Renal function improved.    6.  Normocytic anemia/iron deficiency anemia Hemoglobin remained stable.  Patient with no noted overt bleeding.   7.  History of coronary artery disease status post multiple stents Remained stable during the hospitalization.  Patient denied any chest pain.  Patient's cardiology office visit 07/24/2017 patient noted to be on aspirin and Plavix at that time.  On patient's home med rec Eliquis is noted.  Review of care everywhere records it is noted that patient started on Eliquis secondary to paroxysmal atrial fibrillation and a prior history of CVA per MRI obtained during prior hospitalizations. Outpatient follow-up with cardiology.  8.  Elevated LFTs Questionable etiology.  May be secondary to developing sepsis.  LFTs trended down.  Right upper quadrant ultrasound negative for acute cholecystitis.  CT abdomen and pelvis negative for bowel obstruction or bowel wall thickening.  Small left greater than right pleural effusions with bibasilar atelectasis or minimal infiltrate.  Fat-containing ventral hernias.  Diffuse subcutaneous edema with fluid in the left greater than right flank.  Statin was held and will be resumed on discharge.   9.  Hypokalemia/hypomagnesemia Repleted.   10.  Dementia Patient with noted agitation  during the hospitalization that was initially felt to be improving after starting patient on Seroquel 3 times daily.  Per RN patient very sleepy throughout  the day and as such Seroquel was changed to nightly.  Patient was also maintained on his Zyprexa.  Outpatient follow-up with neurology.  11.  Gastroesophageal reflux disease Patient maintained on a PPI.    #12  Hx paroxysmal atrial fibrillation/prior history of CVA --per hospitalization 08/02/2017 at [facility] Patient noted on admission to be on Eliquis.  Care everywhere during the hospitalization at [facility] 08/02/2017 it is noted that patient does have a history of paroxysmal atrial fibrillation. CHA2DS2VASC score of 7.  During that hospitalization patient noted to be started on Eliquis for stroke prophylaxis, and patient's aspirin and Plavix discontinued.  It is noted that patient was tried on Cardizem which was discontinued due to hypotension.  Patient with tachycardia in the low 100s.  Patient was subsequently started on low-dose immediate release Cardizem of 30 mg p.o. every 8 for rate control.  Blood pressure remained stable.  Outpatient follow-up with cardiology.    13.  Diabetes mellitus Patient was maintained on sliding scale insulin throughout the hospitalization.  Outpatient follow-up.   14.  Left supracondylar femur fracture Patient being followed by Dr. Ilda Basset orthopedics in [facility] and was last seen on 09/10/2017.  Per orthopedics note patient okay for full weightbearing as tolerated.  PT/OT.  Patient with no overt bleeding and as such resumed Eliquis was resumed.   15.  Hypokalemia Repleted.  16.  Dysphasia Patient has been seen by speech therapy and started on a dysphagia 1 diet with nectar thick liquids.  Will need outpatient follow-up with speech therapy.  17.  Pressure injury stage I/II sacrum Patient wound managed in hospital.    Procedures:  CT head 09/18/2017  CT abdomen and pelvis 09/18/2017  Right upper quadrant ultrasound 09/17/2017  Chest x-ray 09/17/2017      Consultations:  None  Discharge Exam: Vitals:   09/23/17 0914 09/23/17  1416  BP: 109/64 (!) 104/52  Pulse: 98 (!) 102  Resp:    Temp:  98.3 F (36.8 C)  SpO2:      General: NAD Cardiovascular: RRR Respiratory: CTAB  Discharge Instructions   Discharge Instructions    Diet - low sodium heart healthy   Complete by:  As directed    Dysphagia 1 diet with nectar thick liquids.  Aspiration precautions.   Increase activity slowly   Complete by:  As directed      Allergies as of 09/23/2017      Reactions   Allegra [fexofenadine] Anaphylaxis   Carvedilol Anaphylaxis   Sulfamethoxazole-trimethoprim Anaphylaxis   Sulfanilamide Anaphylaxis   Adhesive [tape]    Causes Blisters.   Ceftriaxone Diarrhea   Cephalexin Hives   Clindamycin/lincomycin Hives   Clopidogrel    Ezetimibe Nausea Only   Haloperidol And Related Other (See Comments)   Confusion   Hydrocodone Hives   Insulin Degludec    Heart Flutters   Insulin Glargine    Headache   Isosorbide Mononitrate [isosorbide Dinitrate Er]    Excessive sleep   Moxifloxacin    Other    Phenylpropanolamin-Hydrocodone   Prasugrel    Bleeding   Ramipril    Rosuvastatin Hives   Causes her heart to race and pounding in the chest.   Tylenol [acetaminophen]    Atorvastatin Calcium Palpitations   Minocycline Rash      Medication List    STOP taking these medications   metoprolol tartrate 25 MG tablet Commonly known as:  LOPRESSOR     TAKE these medications   allopurinol 100 MG tablet Commonly known as:  ZYLOPRIM Take 100 mg by mouth daily.   amoxicillin-clavulanate 875-125 MG tablet Commonly known as:  AUGMENTIN Take 1 tablet by mouth every 12 (twelve) hours for 3 days.   atorvastatin 80 MG tablet Commonly known as:  LIPITOR Take 80 mg by mouth daily.   diltiazem 30 MG tablet Commonly known as:  CARDIZEM Take 1 tablet (30 mg total) by mouth every 8 (eight) hours.   dorzolamide 2 % ophthalmic solution Commonly known as:  TRUSOPT Place 1 drop into both eyes 3 (three) times daily.    ELIQUIS 5 MG Tabs tablet Generic drug:  apixaban Take 5 mg by mouth 2 (two) times daily.   nitroGLYCERIN 0.4 MG SL tablet Commonly known as:  NITROSTAT Place 0.4 mg under the tongue every 5 (five) minutes as needed for chest pain.   NOVOLOG 100 UNIT/ML injection Generic drug:  insulin aspart Inject 0-12 Units into the skin 4 (four) times daily. 0-150=0 units, 151-200=2 units, 201-250=4 units, 251-300=6 units, 301-350=8 units, 351-400=10 units, 401-450=12 units.   OLANZapine 5 MG tablet Commonly known as:  ZYPREXA Take 5 mg by mouth daily.   omeprazole 20 MG capsule Commonly known as:  PRILOSEC Take 20 mg by mouth 2 (two) times daily before a meal.   QUEtiapine 25 MG tablet Commonly known as:  SEROQUEL Take 0.5 tablets (12.5 mg total)  by mouth at bedtime.   RESOURCE THICKENUP CLEAR Powd Take 120 g by mouth as needed (nectar thick).      Allergies  Allergen Reactions  . Allegra [Fexofenadine] Anaphylaxis  . Carvedilol Anaphylaxis  . Sulfamethoxazole-Trimethoprim Anaphylaxis  . Sulfanilamide Anaphylaxis  . Adhesive [Tape]     Causes Blisters.  . Ceftriaxone Diarrhea  . Cephalexin Hives  . Clindamycin/Lincomycin Hives  . Clopidogrel   . Ezetimibe Nausea Only  . Haloperidol And Related Other (See Comments)    Confusion  . Hydrocodone Hives  . Insulin Degludec     Heart Flutters  . Insulin Glargine     Headache  . Isosorbide Mononitrate [Isosorbide Dinitrate Er]     Excessive sleep  . Moxifloxacin   . Other     Phenylpropanolamin-Hydrocodone  . Prasugrel     Bleeding  . Ramipril   . Rosuvastatin Hives    Causes her heart to race and pounding in the chest.  . Tylenol [Acetaminophen]   . Atorvastatin Calcium Palpitations  . Minocycline Rash   Follow-up Information    PCP. Schedule an appointment as soon as possible for a visit in 2 week(s).        Cardiology Follow up.   Why:  Follow-up in 1-2 weeks.           The results of significant  diagnostics from this hospitalization (including imaging, microbiology, ancillary and laboratory) are listed below for reference.    Significant Diagnostic Studies: Ct Abdomen Pelvis Wo Contrast  Result Date: 09/18/2017 CLINICAL DATA:  Abdomen distension with nausea and vomiting EXAM: CT ABDOMEN AND PELVIS WITHOUT CONTRAST TECHNIQUE: Multidetector CT imaging of the abdomen and pelvis was performed following the standard protocol without IV contrast. COMPARISON:  Ultrasound 09/17/2017, report 03/17/2013 FINDINGS: Lower chest: Small left greater than right pleural effusion. Cardiomegaly with coronary vascular calcification. Small moderate hiatal hernia with surgical clips near the GE junction. Hepatobiliary: No focal liver abnormality is seen. Status post cholecystectomy. No biliary dilatation. Pancreas: Unremarkable. No pancreatic ductal dilatation or surrounding inflammatory changes. Spleen: Normal in size without focal abnormality. Adrenals/Urinary Tract: Adrenal glands are within normal limits. Kidneys show no hydronephrosis. The bladder is under distended Stomach/Bowel: Stomach is within normal limits. Appendix appears normal. No evidence of bowel wall thickening, distention, or inflammatory changes. Vascular/Lymphatic: Moderate aortic atherosclerosis. No aneurysmal dilatation. No significantly enlarged lymph nodes. Reproductive: Status post hysterectomy. No adnexal masses. Other: Negative for free air or significant free fluid. Ventral hernia containing mesenteric fat within the upper abdominal wall. Supraumbilical hernia containing fat with abdominal wall laxity and protrusion of small bowel loops and mesenteric fat anteriorly. Diffuse subcutaneous edema with fluid in the left greater than right flank regions. Musculoskeletal: Degenerative changes. No acute or suspicious lesion. IMPRESSION: 1. Negative for a bowel obstruction or bowel wall thickening. 2. Small left greater than right pleural effusions with  bibasilar atelectasis or minimal infiltrate. 3. Fat containing ventral hernias. 4. Diffuse subcutaneous edema with fluid in the left greater than right flank. Electronically Signed   By: Jasmine Pang M.D.   On: 09/18/2017 03:02   Ct Head Wo Contrast  Result Date: 09/18/2017 CLINICAL DATA:  Altered level of consciousness, delirium with agitation EXAM: CT HEAD WITHOUT CONTRAST TECHNIQUE: Contiguous axial images were obtained from the base of the skull through the vertex without intravenous contrast. COMPARISON:  08/03/2017 head CT, MRI 06/07/2017 FINDINGS: Brain: No acute territorial infarction, hemorrhage or intracranial mass is visualized. Atrophy and mild small vessel ischemic changes of  the white matter. Stable ventricle size. Vascular: No hyperdense vessels.  Carotid vascular calcification Skull: Normal. Negative for fracture or focal lesion. Sinuses/Orbits: Mild mucosal thickening in the maxillary and ethmoid sinuses. No acute orbital abnormality. Other: None IMPRESSION: 1. No CT evidence for acute intracranial abnormality. 2. Atrophy and small vessel ischemic changes of the white matter. Electronically Signed   By: Jasmine Pang M.D.   On: 09/18/2017 02:52   Dg Chest Port 1 View  Result Date: 09/20/2017 CLINICAL DATA:  Encephalopathy EXAM: PORTABLE CHEST 1 VIEW COMPARISON:  09/17/2017 FINDINGS: Progression of bilateral airspace disease with perihilar component suggesting heart failure. Progression of bibasilar atelectasis and small effusions. Cardiac enlargement. Left coronary stents. IMPRESSION: Progressive heart failure with edema. Progressive bibasilar atelectasis and small effusions. Electronically Signed   By: Marlan Palau M.D.   On: 09/20/2017 09:59   Dg Chest Port 1 View  Result Date: 09/17/2017 CLINICAL DATA:  Altered mental status.  Sepsis. EXAM: PORTABLE CHEST 1 VIEW COMPARISON:  08/08/2017 FINDINGS: Patient rotated left. Artifact projects over the medial upper right lung. Cardiomegaly  accentuated by AP portable technique. Probable small bilateral pleural effusions. No pneumothorax. Persistent bibasilar airspace disease. Low lung volumes with resultant pulmonary interstitial prominence. IMPRESSION: Probable small bilateral pleural effusions. Bibasilar airspace disease could represent persistent or recurrent atelectasis versus infection. Cardiomegaly and low lung volumes.  No overt congestive failure. Electronically Signed   By: Jeronimo Greaves M.D.   On: 09/17/2017 20:43   US Abdomen Limited Ruq  Result Date: 09/17/2017 CLINICAL DATA:  Right upper quadrant pain. EXAM: ULTRASOUND ABDOMEN LIMITED RIGHT UPPER QUADRANT COMPARISON:  None. FINDINGS: Gallbladder: Surgically absent. Common bile duct: Diameter: 6 mm. Liver: Technically limited exam due to patient motion and inability to hold position. No focal lesion identified. Grossly within normal limits in parenchymal echogenicity. Portal vein is patent on color Doppler imaging with normal direction of blood flow towards the liver. IMPRESSION: 1. Postcholecystectomy without biliary dilatation. 2. No gross focal hepatic abnormality demonstrated sonographically. Electronically Signed   By: Rubye Oaks M.D.   On: 09/17/2017 23:44    Microbiology: Recent Results (from the past 240 hour(s))  Blood Culture (routine x 2)     Status: None   Collection Time: 09/17/17  9:26 PM  Result Value Ref Range Status   Specimen Description   Final    BLOOD RIGHT FOREARM Performed at [facility], [address] [place], [place] [address]    Special Requests   Final    BOTTLES DRAWN AEROBIC AND ANAEROBIC Blood Culture adequate volume Performed at [facility], [address], [place], [place] [address]    Culture   Final    NO GROWTH 5 DAYS Performed at [facility], [address] [place], [place] [address]    Report Status 09/22/2017 FINAL  Final  Blood Culture (routine x 2)     Status: Abnormal    Collection Time: 09/17/17  9:26 PM  Result Value Ref Range Status   Specimen Description   Final    BLOOD RIGHT ARM Performed at [facility], [address] [place], [place] [address]    Special Requests   Final    BOTTLES DRAWN AEROBIC AND ANAEROBIC Blood Culture adequate volume Performed at [facility], [address] [place], [place] [address]    Culture  Setup Time   Final    GRAM POSITIVE COCCI AEROBIC BOTTLE ONLY CRITICAL RESULT CALLED TO, READ BACK  BY AND VERIFIED WITH: B GREEN PHARMD 09/19/17 0240 JDW    Culture (A)  Final    STAPHYLOCOCCUS SPECIES (COAGULASE NEGATIVE) THE SIGNIFICANCE OF ISOLATING THIS ORGANISM FROM A SINGLE SET OF BLOOD CULTURES WHEN MULTIPLE SETS ARE DRAWN IS UNCERTAIN. PLEASE NOTIFY THE MICROBIOLOGY DEPARTMENT WITHIN ONE WEEK IF SPECIATION AND SENSITIVITIES ARE REQUIRED. Performed at [facility], [address], [place], [place] [address]    Report Status 09/20/2017 FINAL  Final  Urine culture     Status: Abnormal   Collection Time: 09/17/17  9:26 PM  Result Value Ref Range Status   Specimen Description   Final    URINE, CATHETERIZED Performed at [facility], [address], [place], [place] [address]    Special Requests   Final    NONE Performed at [facility], [address] [place], [place] [address]    Culture MULTIPLE SPECIES PRESENT, SUGGEST RECOLLECTION (A)  Final   Report Status 09/19/2017 FINAL  Final  Blood Culture ID Panel (Reflexed)     Status: Abnormal   Collection Time: 09/17/17  9:26 PM  Result Value Ref Range Status   Enterococcus species NOT DETECTED NOT DETECTED Final   Listeria monocytogenes NOT DETECTED NOT DETECTED Final   Staphylococcus species DETECTED (A) NOT DETECTED Final    Comment: Methicillin (oxacillin) resistant coagulase negative staphylococcus. Possible blood culture contaminant (unless isolated from more than one blood culture  draw or clinical case suggests pathogenicity). No antibiotic treatment is indicated for blood  culture contaminants. CRITICAL RESULT CALLED TO, READ BACK BY AND VERIFIED WITH: B GREEN PHARMD 09/19/17 0240 JDW    Staphylococcus aureus NOT DETECTED NOT DETECTED Final   Methicillin resistance DETECTED (A) NOT DETECTED Final    Comment: CRITICAL RESULT CALLED TO, READ BACK BY AND VERIFIED WITH: B GREEN PHARMD 09/19/17 0240 JDW    Streptococcus species NOT DETECTED NOT DETECTED Final   Streptococcus agalactiae NOT DETECTED NOT DETECTED Final   Streptococcus pneumoniae NOT DETECTED NOT DETECTED Final   Streptococcus pyogenes NOT DETECTED NOT DETECTED Final   Acinetobacter baumannii NOT DETECTED NOT DETECTED Final   Enterobacteriaceae species NOT DETECTED NOT DETECTED Final   Enterobacter cloacae complex NOT DETECTED NOT DETECTED Final   Escherichia coli NOT DETECTED NOT DETECTED Final   Klebsiella oxytoca NOT DETECTED NOT DETECTED Final   Klebsiella pneumoniae NOT DETECTED NOT DETECTED Final   Proteus species NOT DETECTED NOT DETECTED Final   Serratia marcescens NOT DETECTED NOT DETECTED Final   Haemophilus influenzae NOT DETECTED NOT DETECTED Final   Neisseria meningitidis NOT DETECTED NOT DETECTED Final   Pseudomonas aeruginosa NOT DETECTED NOT DETECTED Final   Candida albicans NOT DETECTED NOT DETECTED Final   Candida glabrata NOT DETECTED NOT DETECTED Final   Candida krusei NOT DETECTED NOT DETECTED Final   Candida parapsilosis NOT DETECTED NOT DETECTED Final   Candida tropicalis NOT DETECTED NOT DETECTED Final    Comment: Performed at [facility], [address], [place], [place] [address]  MRSA PCR Screening     Status: Abnormal   Collection Time: 09/18/17  9:08 PM  Result Value Ref Range Status   MRSA by PCR POSITIVE (A) NEGATIVE Final    Comment:        The GeneXpert MRSA Assay (FDA approved for NASAL specimens only), is one component of a comprehensive MRSA  colonization surveillance program. It is not intended to diagnose MRSA infection nor to guide or monitor treatment for MRSA  infections. RESULT CALLED TO, READ BACK BY AND VERIFIED WITH: Stephanie AcreJUNIOR RIMANDO,RN [phone] @ 2330 BY J SCOTTON Performed at [facility], [address] [place], [place] [id number]      Labs: Basic Metabolic Panel: Recent Labs  Lab 09/18/17 0857 09/19/17 1032 09/20/17 1137 09/21/17 0334 09/22/17 0518 09/23/17 0953  NA 137 139 138 141 140 138  K 3.3* 3.0* 3.9 3.8 3.4* 5.3*  CL 100* 102 105 106 107 106  CO2 23 26 21* 22 23 22   GLUCOSE 144* 108* 168* 136* 153* 192*  BUN 20 17 15 13 13 12   CREATININE 1.34* 1.05* 0.96 0.87 0.83 0.84  CALCIUM 8.2* 8.3* 8.3* 8.3* 8.4* 8.6*  MG 1.8 2.5* 2.2  --   --   --    Liver Function Tests: Recent Labs  Lab 09/17/17 2126 09/18/17 0857 09/19/17 1032 09/20/17 1137  AST 133* 133* 81* 55*  ALT 42 42 35 32  ALKPHOS 151* 146* 142* 137*  BILITOT 1.9* 1.5* 1.6* 1.4*  PROT 5.9* 5.9* 6.0* 5.9*  ALBUMIN 2.4* 2.3* 2.4* 2.3*   No results for input(s): LIPASE, AMYLASE in the last 168 hours. Recent Labs  Lab 09/18/17 0857  AMMONIA 24   CBC: Recent Labs  Lab 09/17/17 2126 09/18/17 0857 09/19/17 1032 09/20/17 0900 09/21/17 0334 09/22/17 0801  WBC 10.4 8.2 8.2 7.1 5.9 6.5  NEUTROABS 8.0* 5.6  --  5.1  --   --   HGB 11.5* 10.8* 11.1* 11.2* 10.8* 11.6*  HCT 35.1* 34.1* 34.4* 35.9* 33.8* 37.4  MCV 84.2 85.9 83.3 85.3 84.5 87.8  PLT 245 212 211 169 153 164   Cardiac Enzymes: No results for input(s): CKTOTAL, CKMB, CKMBINDEX, TROPONINI in the last 168 hours. BNP: BNP (last 3 results) No results for input(s): BNP in the last 8760 hours.  ProBNP (last 3 results) No results for input(s): PROBNP in the last 8760 hours.  CBG: Recent Labs  Lab 09/22/17 1116 09/22/17 1637 09/22/17 2026 09/23/17 0811 09/23/17 1149  GLUCAP 120* 108* 149* 179* 177*       Signed:  Ramiro Harvestaniel Danasha Melman MD.  [facility] 09/23/2017, 3:17 PM

## 2017-09-23 NOTE — Progress Notes (Signed)
Pt discharged to home spoke with [name], pt dtg at 1530 and [place] called [place] to ensure someone will be home when pt arrives. SRP, RN

## 2017-09-23 NOTE — Progress Notes (Signed)
Son n law arrived around 1700 and inquired about pt discharge, spoke with son n law earlier in the day appro 1130am or so to inform that pt will be discharge to home later to day, he stated he will call his wife to update. SRP, RN

## 2017-09-23 NOTE — Progress Notes (Signed)
Patient refused lab draws this morning.

## 2017-09-23 NOTE — Progress Notes (Signed)
   09/23/17 1600  Clinical Encounter Type  Visited With Patient;Health care provider  Visit Type Initial  Spiritual Encounters  Spiritual Needs Emotional   While rounding on 4 encountered this patient and asked the nurse about a visit.  Sat with the patient as she seemed anxious.  Nurse indicated she is being discharged to her daughters home today.  Stayed with the patient and prayed with her as she asked.  Was able to bring some calm as th NT's came to help her get ready to go.  Will follow and support as needed. Chaplain Agustin CreeNewton Sutton Plake

## 2017-09-23 NOTE — Progress Notes (Signed)
P-TAR in to pick up pt, dtg at bedside---reviewed discharge instruction with patient. Pt continues to ramble with AMS, son n law at bedside. Pharmacy closed for pt and family request one dose of night meds, Augmentin, Cardizem and eliquis given to daughter for tonight administration. SRP, RN

## 2017-09-23 NOTE — Progress Notes (Signed)
Pt discharging to daughter's [name] [address]home:[address], [place], [place] [id number].

## 2017-09-23 NOTE — Progress Notes (Signed)
Spoke with pt's daughter via telephone [name] [phone] work, [phone] mobile, concerning home health. D[facility] Daughter states pt will need PTAR for transportation home.

## 2017-09-24 MED FILL — dilTIAZem HCL 30 MG TABS: 30 | 30 days supply | Qty: 90 | Fill #0

## 2017-09-24 MED FILL — AMOX TR-K CLV 875-125 MG TA: 875-125 | 3 days supply | Qty: 6 | Fill #0

## 2017-10-01 ENCOUNTER — Emergency Department (HOSPITAL_COMMUNITY): Payer: Medicare Other

## 2017-10-01 ENCOUNTER — Emergency Department (HOSPITAL_BASED_OUTPATIENT_CLINIC_OR_DEPARTMENT_OTHER)
Admit: 2017-10-01 | Discharge: 2017-10-01 | Disposition: A | Discharge status: Home / Self Care | Payer: Medicare Other | Attending: Emergency Medicine | Admitting: Emergency Medicine

## 2017-10-01 ENCOUNTER — Other Ambulatory Visit: Payer: Self-pay

## 2017-10-01 ENCOUNTER — Inpatient Hospital Stay (HOSPITAL_COMMUNITY)
Admission: EM | Admit: 2017-10-01 | Discharge: 2017-10-09 | DRG: 291 | Disposition: A | Discharge status: Hospice | Payer: Medicare Other | Attending: Internal Medicine | Admitting: Internal Medicine

## 2017-10-01 DIAGNOSIS — I480 Paroxysmal atrial fibrillation: Secondary | ICD-10-CM | POA: Diagnosis present

## 2017-10-01 DIAGNOSIS — I10 Essential (primary) hypertension: Secondary | ICD-10-CM | POA: Diagnosis not present

## 2017-10-01 DIAGNOSIS — R4587 Impulsiveness: Secondary | ICD-10-CM | POA: Diagnosis not present

## 2017-10-01 DIAGNOSIS — E785 Hyperlipidemia, unspecified: Secondary | ICD-10-CM | POA: Diagnosis present

## 2017-10-01 DIAGNOSIS — Z8673 Personal history of transient ischemic attack (TIA), and cerebral infarction without residual deficits: Secondary | ICD-10-CM | POA: Diagnosis not present

## 2017-10-01 DIAGNOSIS — Z515 Encounter for palliative care: Secondary | ICD-10-CM | POA: Diagnosis not present

## 2017-10-01 DIAGNOSIS — Z66 Do not resuscitate: Secondary | ICD-10-CM | POA: Diagnosis present

## 2017-10-01 DIAGNOSIS — I509 Heart failure, unspecified: Secondary | ICD-10-CM | POA: Diagnosis not present

## 2017-10-01 DIAGNOSIS — Z7901 Long term (current) use of anticoagulants: Secondary | ICD-10-CM | POA: Diagnosis not present

## 2017-10-01 DIAGNOSIS — E119 Type 2 diabetes mellitus without complications: Secondary | ICD-10-CM | POA: Diagnosis present

## 2017-10-01 DIAGNOSIS — N390 Urinary tract infection, site not specified: Secondary | ICD-10-CM | POA: Diagnosis present

## 2017-10-01 DIAGNOSIS — I110 Hypertensive heart disease with heart failure: Secondary | ICD-10-CM | POA: Diagnosis present

## 2017-10-01 DIAGNOSIS — R609 Edema, unspecified: Secondary | ICD-10-CM

## 2017-10-01 DIAGNOSIS — L89151 Pressure ulcer of sacral region, stage 1: Secondary | ICD-10-CM | POA: Diagnosis not present

## 2017-10-01 DIAGNOSIS — R410 Disorientation, unspecified: Secondary | ICD-10-CM | POA: Diagnosis not present

## 2017-10-01 DIAGNOSIS — F05 Delirium due to known physiological condition: Secondary | ICD-10-CM | POA: Diagnosis present

## 2017-10-01 DIAGNOSIS — Z87440 Personal history of urinary (tract) infections: Secondary | ICD-10-CM | POA: Diagnosis not present

## 2017-10-01 DIAGNOSIS — G92 Toxic encephalopathy: Secondary | ICD-10-CM | POA: Diagnosis present

## 2017-10-01 DIAGNOSIS — I2510 Atherosclerotic heart disease of native coronary artery without angina pectoris: Secondary | ICD-10-CM | POA: Diagnosis present

## 2017-10-01 DIAGNOSIS — I429 Cardiomyopathy, unspecified: Secondary | ICD-10-CM

## 2017-10-01 DIAGNOSIS — M109 Gout, unspecified: Secondary | ICD-10-CM | POA: Diagnosis present

## 2017-10-01 DIAGNOSIS — I5023 Acute on chronic systolic (congestive) heart failure: Secondary | ICD-10-CM | POA: Diagnosis present

## 2017-10-01 DIAGNOSIS — Z8249 Family history of ischemic heart disease and other diseases of the circulatory system: Secondary | ICD-10-CM

## 2017-10-01 DIAGNOSIS — Z955 Presence of coronary angioplasty implant and graft: Secondary | ICD-10-CM

## 2017-10-01 DIAGNOSIS — Z794 Long term (current) use of insulin: Secondary | ICD-10-CM | POA: Diagnosis not present

## 2017-10-01 DIAGNOSIS — Z79899 Other long term (current) drug therapy: Secondary | ICD-10-CM | POA: Diagnosis not present

## 2017-10-01 DIAGNOSIS — I5043 Acute on chronic combined systolic (congestive) and diastolic (congestive) heart failure: Secondary | ICD-10-CM | POA: Diagnosis not present

## 2017-10-01 DIAGNOSIS — F0390 Unspecified dementia without behavioral disturbance: Secondary | ICD-10-CM | POA: Diagnosis present

## 2017-10-01 DIAGNOSIS — M7989 Other specified soft tissue disorders: Secondary | ICD-10-CM | POA: Diagnosis not present

## 2017-10-01 DIAGNOSIS — I4581 Long QT syndrome: Secondary | ICD-10-CM | POA: Diagnosis present

## 2017-10-01 DIAGNOSIS — I340 Nonrheumatic mitral (valve) insufficiency: Secondary | ICD-10-CM | POA: Diagnosis not present

## 2017-10-01 DIAGNOSIS — M79606 Pain in leg, unspecified: Secondary | ICD-10-CM

## 2017-10-01 LAB — BASIC METABOLIC PANEL
ANION GAP: 12 (ref 5–15)
BUN: 11 mg/dL (ref 6–20)
CO2: 26 mmol/L (ref 22–32)
Calcium: 8.8 mg/dL — ABNORMAL LOW (ref 8.9–10.3)
Chloride: 104 mmol/L (ref 101–111)
Creatinine, Ser: 0.74 mg/dL (ref 0.44–1.00)
GFR calc Af Amer: >60 mL/min (ref >60)
Glucose, Bld: 136 mg/dL — ABNORMAL HIGH (ref 65–99)
POTASSIUM: 4.8 mmol/L (ref 3.5–5.1)
SODIUM: 142 mmol/L (ref 135–145)

## 2017-10-01 LAB — CBC WITH DIFFERENTIAL/PLATELET
Basophils Absolute: 0 10*3/uL (ref 0.0–0.1)
Basophils Relative: 0 %
EOS PCT: 1 %
Eosinophils Absolute: 0.1 10*3/uL (ref 0.0–0.7)
HCT: 41.8 % (ref 36.0–46.0)
Hemoglobin: 12.7 g/dL (ref 12.0–15.0)
LYMPHS PCT: 14 %
Lymphs Abs: 1.5 10*3/uL (ref 0.7–4.0)
MCH: 26.4 pg (ref 26.0–34.0)
MCHC: 30.4 g/dL (ref 30.0–36.0)
MCV: 86.9 fL (ref 78.0–100.0)
Monocytes Absolute: 0.8 10*3/uL (ref 0.1–1.0)
Monocytes Relative: 7 %
Neutro Abs: 8.7 10*3/uL — ABNORMAL HIGH (ref 1.7–7.7)
Neutrophils Relative %: 78 %
PLATELETS: 202 10*3/uL (ref 150–400)
RBC: 4.81 MIL/uL (ref 3.87–5.11)
RDW: 20.9 % — ABNORMAL HIGH (ref 11.5–15.5)
WBC: 11.1 10*3/uL — ABNORMAL HIGH (ref 4.0–10.5)

## 2017-10-01 LAB — HEPATIC FUNCTION PANEL
ALBUMIN: 2.5 g/dL — AB (ref 3.5–5.0)
ALT: 14 U/L (ref 14–54)
AST: 46 U/L — ABNORMAL HIGH (ref 15–41)
Alkaline Phosphatase: 144 U/L — ABNORMAL HIGH (ref 38–126)
Bilirubin, Direct: 0.5 mg/dL (ref 0.1–0.5)
Indirect Bilirubin: 0.7 mg/dL (ref 0.3–0.9)
TOTAL PROTEIN: 6.3 g/dL — AB (ref 6.5–8.1)
Total Bilirubin: 1.2 mg/dL (ref 0.3–1.2)

## 2017-10-01 LAB — BRAIN NATRIURETIC PEPTIDE: B NATRIURETIC PEPTIDE 5: 729.1 pg/mL — AB (ref 0.0–100.0)

## 2017-10-01 LAB — URINALYSIS, ROUTINE W REFLEX MICROSCOPIC
Glucose, UA: NEGATIVE mg/dL
HGB URINE DIPSTICK: NEGATIVE
Ketones, ur: 5 mg/dL — AB
Nitrite: NEGATIVE
Protein, ur: 100 mg/dL — AB
Specific Gravity, Urine: 1.025 (ref 1.005–1.030)
pH: 5 (ref 5.0–8.0)

## 2017-10-01 LAB — TROPONIN I: Troponin I: 0.04 ng/mL (ref <0.03)

## 2017-10-01 MED ORDER — ATORVASTATIN CALCIUM 40 MG PO TABS
80.0000 mg | ORAL_TABLET | Freq: Every day | ORAL | Status: DC
  Administered 2017-10-02 – 2017-10-06 (×5): 80 mg via ORAL
  Filled 2017-10-01 (×6): qty 2

## 2017-10-01 MED ORDER — SODIUM CHLORIDE 0.9 % IV SOLN
1.0000 g | Freq: Once | INTRAVENOUS | Status: AC
  Administered 2017-10-01: 1 g via INTRAVENOUS
  Filled 2017-10-01: qty 10

## 2017-10-01 MED ORDER — ONDANSETRON HCL 4 MG/2ML IJ SOLN: 4.0000 mg | Freq: Four times a day (QID) | INTRAMUSCULAR | Status: DC | PRN

## 2017-10-01 MED ORDER — SODIUM CHLORIDE 0.9 % IV SOLN: 250.0000 mL | INTRAVENOUS | Status: DC | PRN

## 2017-10-01 MED ORDER — FUROSEMIDE 10 MG/ML IJ SOLN
40.0000 mg | Freq: Once | INTRAMUSCULAR | Status: AC
  Administered 2017-10-01: 40 mg via INTRAVENOUS
  Filled 2017-10-01: qty 4

## 2017-10-01 MED ORDER — FUROSEMIDE 10 MG/ML IJ SOLN
40.0000 mg | Freq: Two times a day (BID) | INTRAMUSCULAR | Status: DC
  Administered 2017-10-02 – 2017-10-03 (×3): 40 mg via INTRAVENOUS
  Filled 2017-10-01 (×3): qty 4

## 2017-10-01 MED ORDER — DILTIAZEM HCL 30 MG PO TABS
30.0000 mg | ORAL_TABLET | Freq: Three times a day (TID) | ORAL | Status: DC
  Administered 2017-10-02 – 2017-10-03 (×4): 30 mg via ORAL
  Filled 2017-10-01 (×7): qty 1

## 2017-10-01 MED ORDER — PANTOPRAZOLE SODIUM 40 MG PO TBEC
40.0000 mg | DELAYED_RELEASE_TABLET | Freq: Every day | ORAL | Status: DC
  Administered 2017-10-02 – 2017-10-07 (×5): 40 mg via ORAL
  Filled 2017-10-01 (×6): qty 1

## 2017-10-01 MED ORDER — QUETIAPINE FUMARATE 25 MG PO TABS
12.5000 mg | ORAL_TABLET | Freq: Every day | ORAL | Status: DC
  Administered 2017-10-02 – 2017-10-03 (×2): 12.5 mg via ORAL
  Filled 2017-10-01 (×3): qty 1

## 2017-10-01 MED ORDER — RESOURCE THICKENUP CLEAR PO POWD
1.0000 | ORAL | Status: DC | PRN
  Filled 2017-10-01: qty 125

## 2017-10-01 MED ORDER — ORAL CARE MOUTH RINSE
15.0000 mL | Freq: Two times a day (BID) | OROMUCOSAL | Status: DC
  Administered 2017-10-02 – 2017-10-08 (×13): 15 mL via OROMUCOSAL

## 2017-10-01 MED ORDER — APIXABAN 5 MG PO TABS
5.0000 mg | ORAL_TABLET | Freq: Two times a day (BID) | ORAL | Status: DC
  Administered 2017-10-02 – 2017-10-09 (×11): 5 mg via ORAL
  Filled 2017-10-01 (×13): qty 1

## 2017-10-01 MED ORDER — MELATONIN 1 MG PO TABS
1.0000 mg | ORAL_TABLET | Freq: Every day | ORAL | Status: DC
  Administered 2017-10-02 – 2017-10-06 (×2): 1 mg via ORAL
  Filled 2017-10-01 (×8): qty 1

## 2017-10-01 MED ORDER — ALLOPURINOL 100 MG PO TABS
100.0000 mg | ORAL_TABLET | Freq: Every day | ORAL | Status: DC
  Administered 2017-10-02 – 2017-10-07 (×5): 100 mg via ORAL
  Filled 2017-10-01 (×6): qty 1

## 2017-10-01 MED ORDER — INSULIN ASPART 100 UNIT/ML ~~LOC~~ SOLN: 0.0000 [IU] | Freq: Four times a day (QID) | SUBCUTANEOUS | Status: DC

## 2017-10-01 MED ORDER — NITROGLYCERIN 0.4 MG SL SUBL: 0.4000 mg | SUBLINGUAL_TABLET | SUBLINGUAL | Status: DC | PRN

## 2017-10-01 MED ORDER — DORZOLAMIDE HCL 2 % OP SOLN
1.0000 [drp] | Freq: Three times a day (TID) | OPHTHALMIC | Status: DC
Start: 2017-10-01 — End: 2017-10-09
  Administered 2017-10-02 – 2017-10-08 (×20): 1 [drp] via OPHTHALMIC
  Filled 2017-10-01: qty 10

## 2017-10-01 MED ORDER — ONDANSETRON HCL 4 MG PO TABS: 4.0000 mg | ORAL_TABLET | Freq: Four times a day (QID) | ORAL | Status: DC | PRN

## 2017-10-01 MED ORDER — SODIUM CHLORIDE 0.9% FLUSH
3.0000 mL | Freq: Two times a day (BID) | INTRAVENOUS | Status: DC
  Administered 2017-10-02 – 2017-10-09 (×15): 3 mL via INTRAVENOUS

## 2017-10-01 MED ORDER — OLANZAPINE 5 MG PO TABS
5.0000 mg | ORAL_TABLET | Freq: Every day | ORAL | Status: DC
  Administered 2017-10-02 – 2017-10-03 (×2): 5 mg via ORAL
  Filled 2017-10-01 (×2): qty 1

## 2017-10-01 MED ORDER — DOCUSATE SODIUM 100 MG PO CAPS
100.0000 mg | ORAL_CAPSULE | Freq: Two times a day (BID) | ORAL | Status: DC
  Administered 2017-10-02 – 2017-10-07 (×8): 100 mg via ORAL
  Filled 2017-10-01 (×10): qty 1

## 2017-10-01 MED ORDER — SODIUM CHLORIDE 0.9% FLUSH
3.0000 mL | INTRAVENOUS | Status: DC | PRN
Start: 2017-10-01 — End: 2017-10-09

## 2017-10-01 NOTE — ED Notes (Signed)
This nurse collected full rainbow and sent to lab

## 2017-10-01 NOTE — ED Notes (Signed)
Bed: WA24 Expected date:  Expected time:  Means of arrival:  Comments: ems 

## 2017-10-01 NOTE — ED Triage Notes (Signed)
Pt to ED via [facility] with c/o of fluid retention in her extremities as well as weeping ulcers on pt left arm and sacral.  Pt was recently seen here on the 5th with diagnosis of sepsis and discharged on the 11th from chronic UTIs. Pt Is A&O x3 today and tachy. Pt family is on the way.  EMS vitals 112/67 117 pulse  18RR 99% RA

## 2017-10-01 NOTE — ED Notes (Signed)
ED TO INPATIENT HANDOFF REPORT  Name/Age/Gender Stacy Dorsey 72 y.o. female  Code Status Code Status History    Date Active Date Inactive Code Status Order ID Comments User Context   09/18/2017 01:12 09/23/2017 21:18 Full Code [id number]  Rise Patience, MD ED      Home/SNF/Other Home  Chief Complaint altered mental status   Level of Care/Admitting Diagnosis ED Disposition    ED Disposition Condition [place]: [place] [[id number]]  Level of Care: Telemetry [5]  Admit to tele based on following criteria: Acute CHF  Diagnosis: CHF (congestive heart failure), NYHA class III, acute on chronic, combined [facility]) [[id number]]  Admitting Physician: Elwyn Reach [[id number]]  Attending Physician: Elwyn Reach [[id number]]  Estimated length of stay: past midnight tomorrow  Certification:: I certify this patient will need inpatient services for at least 2 midnights  PT Class (Do Not Modify): Inpatient [101]  PT Acc Code (Do Not Modify): Private [1]       Medical History Past Medical History:  Diagnosis Date  . CHF (congestive heart failure) ([place])   . CVA (cerebral vascular accident) ([place]): Hx of CVA 09/21/2017  . Diabetes mellitus without complication ([place])   . Hypertension   . Paroxysmal A-fib ([place]): Hx of per hospitalization of 08/02/2017--[facility] 09/21/2017    Allergies Allergies  Allergen Reactions  . Allegra [Fexofenadine] Anaphylaxis  . Carvedilol Anaphylaxis  . Sulfamethoxazole-Trimethoprim Anaphylaxis  . Sulfanilamide Anaphylaxis  . Adhesive [Tape]     Causes Blisters.  . Ceftriaxone Diarrhea  . Cephalexin Hives  . Clindamycin/Lincomycin Hives  . Clopidogrel   . Ezetimibe Nausea Only  . Haloperidol And Related Other (See Comments)    Confusion  . Hydrocodone Hives  . Insulin Degludec     Heart Flutters  . Insulin Glargine     Headache  . Isosorbide Mononitrate [Isosorbide Dinitrate Er]     Excessive sleep  .  Moxifloxacin   . Other     Phenylpropanolamin-Hydrocodone  . Prasugrel     Bleeding  . Ramipril   . Rosuvastatin Hives    Causes her heart to race and pounding in the chest.  . Tylenol [Acetaminophen]   . Atorvastatin Calcium Palpitations  . Minocycline Rash    IV Location/Drains/Wounds Patient Lines/Drains/Airways Status   Active Line/Drains/Airways    Name:   Placement date:   Placement time:   Site:   Days:   Pressure Injury 09/21/17 Stage II -  Partial thickness loss of dermis presenting as a shallow open ulcer with a red, pink wound bed without slough.   09/21/17    1720     10          Labs/Imaging Results for orders placed or performed during the hospital encounter of 10/01/17 (from the past 48 hour(s))  Urinalysis, Routine w reflex microscopic     Status: Abnormal   Collection Time: 10/01/17  5:23 PM  Result Value Ref Range   Color, Urine AMBER (A) YELLOW    Comment: BIOCHEMICALS MAY BE AFFECTED BY COLOR   APPearance CLOUDY (A) CLEAR   Specific Gravity, Urine 1.025 1.005 - 1.030   pH 5.0 5.0 - 8.0   Glucose, UA NEGATIVE NEGATIVE mg/dL   Hgb urine dipstick NEGATIVE NEGATIVE   Bilirubin Urine SMALL (A) NEGATIVE   Ketones, ur 5 (A) NEGATIVE mg/dL   Protein, ur 100 (A) NEGATIVE mg/dL   Nitrite NEGATIVE NEGATIVE   Leukocytes, UA MODERATE (A) NEGATIVE  RBC / HPF 6-30 0 - 5 RBC/hpf   WBC, UA TOO NUMEROUS TO COUNT 0 - 5 WBC/hpf   Bacteria, UA FEW (A) NONE SEEN   Squamous Epithelial / LPF 0-5 (A) NONE SEEN   WBC Clumps PRESENT    Mucus PRESENT    Budding Yeast PRESENT    Hyaline Casts, UA PRESENT     Comment: Performed at [facility], [place] [address], [place], [place] [address]  Basic metabolic panel     Status: Abnormal   Collection Time: 10/01/17  5:39 PM  Result Value Ref Range   Sodium 142 135 - 145 mmol/L   Potassium 4.8 3.5 - 5.1 mmol/L   Chloride 104 101 - 111 mmol/L   CO2 26 22 - 32 mmol/L   Glucose, Bld 136 (H) 65 - 99 mg/dL   BUN  11 6 - 20 mg/dL   Creatinine, Ser 0.74 0.44 - 1.00 mg/dL   Calcium 8.8 (L) 8.9 - 10.3 mg/dL   GFR calc non Af Amer >60 >60 mL/min   GFR calc Af Amer >60 >60 mL/min    Comment: (NOTE) The eGFR has been calculated using the CKD EPI equation. This calculation has not been validated in all clinical situations. eGFR's persistently <60 mL/min signify possible Chronic Kidney Disease.    Anion gap 12 5 - 15    Comment: Performed at [facility], [place] [address] [place], [place] [address]  CBC with Differential     Status: Abnormal   Collection Time: 10/01/17  5:39 PM  Result Value Ref Range   WBC 11.1 (H) 4.0 - 10.5 K/uL   RBC 4.81 3.87 - 5.11 MIL/uL   Hemoglobin 12.7 12.0 - 15.0 g/dL   HCT 41.8 36.0 - 46.0 %   MCV 86.9 78.0 - 100.0 fL   MCH 26.4 26.0 - 34.0 pg   MCHC 30.4 30.0 - 36.0 g/dL   RDW 20.9 (H) 11.5 - 15.5 %   Platelets 202 150 - 400 K/uL    Comment: REPEATED TO VERIFY SPECIMEN CHECKED FOR CLOTS    Neutrophils Relative % 78 %   Neutro Abs 8.7 (H) 1.7 - 7.7 K/uL   Lymphocytes Relative 14 %   Lymphs Abs 1.5 0.7 - 4.0 K/uL   Monocytes Relative 7 %   Monocytes Absolute 0.8 0.1 - 1.0 K/uL   Eosinophils Relative 1 %   Eosinophils Absolute 0.1 0.0 - 0.7 K/uL   Basophils Relative 0 %   Basophils Absolute 0.0 0.0 - 0.1 K/uL    Comment: Performed at [facility], [place] [address] [place], [place] [address]  Brain natriuretic peptide     Status: Abnormal   Collection Time: 10/01/17  5:39 PM  Result Value Ref Range   B Natriuretic Peptide 729.1 (H) 0.0 - 100.0 pg/mL    Comment: Performed at [facility], [place] [address], [place], [place] [address]  Troponin I     Status: Abnormal   Collection Time: 10/01/17  5:39 PM  Result Value Ref Range   Troponin I 0.04 (HH) <0.03 ng/mL    Comment: CRITICAL RESULT CALLED TO, READ BACK BY AND VERIFIED WITH: ADKINS,L. RN _0  ON 03.19.19 BY COHEN,K Performed at [facility], [place] [address], [place], [place] [address]    Dg Chest 2 View  Result Date: 10/01/2017 CLINICAL DATA:  Fluid retention EXAM: CHEST - 2 VIEW COMPARISON:  09/20/2017, 09/17/2016, 08/08/2017 FINDINGS: Small bilateral pleural effusions.  Cardiomegaly with vascular congestion and bilateral perihilar edema. Consolidations at both lung bases. No pneumothorax. IMPRESSION: 1. Small bilateral pleural effusions, not significantly changed 2. Cardiomegaly with vascular congestion and perihilar edema. Bibasilar atelectasis or pneumonia. Electronically Signed   By: Donavan Foil M.D.   On: 10/01/2017 21:00    Pending Labs Unresulted Labs (From admission, onward)   Start     Ordered   10/01/17 2114  Hepatic function panel  STAT,   STAT     10/01/17 2113   10/01/17 1715  Urine culture  STAT,   STAT     10/01/17 1714   Signed and Held  Comprehensive metabolic panel  Tomorrow morning,   R     Signed and Held   Signed and Held  CBC  Tomorrow morning,   R     Signed and Held   Signed and Held  TSH  Once,   R     Signed and Held   Signed and Held  Troponin I  Now then every 6 hours,   R     Signed and Held   Signed and Held  TSH  Once,   R     Signed and Held      Vitals/Pain Today's Vitals   10/01/17 1815 10/01/17 1830 10/01/17 1846 10/01/17 2100  BP: 110/90 115/68 115/68 (!) 118/53  Pulse:  (!) 117 (!) 118 (!) 120  Resp: _0 (!) 7  Temp:      TempSrc:      SpO2:  96% 97% 97%  Weight:      Height:        Isolation Precautions No active isolations  Medications Medications  cefTRIAXone (ROCEPHIN) 1 g in sodium chloride 0.9 % 100 mL IVPB (1 g Intravenous New Bag/Given 10/01/17 2148)  furosemide (LASIX) injection 40 mg (40 mg Intravenous Given 10/01/17 2140)    Mobility non-ambulatory

## 2017-10-01 NOTE — Progress Notes (Signed)
Right lower extremity venous duplex has been completed. Negative for obvious evidence of DVT. Results were given to Dr. Rhunette CroftNanavati.  10/01/17 7:06 PM Olen CordialGreg Taegen Delker RVT

## 2017-10-01 NOTE — ED Triage Notes (Addendum)
Pt [facility] ED [facility] with c/o of fluid retention in her extremities as well as weeping ulcers on pt left arm and sacral.  Pt was recently seen here on the 5th with diagnosis of sepsis and discharged on the 11th from chronic UTIs. Pt Is A&O x3 today and tachy. Pt family is on the way. Pt has hx of Diabetes, HTN, and CHF  EMS vitals 112/67 117 pulse  18RR 99% RA

## 2017-10-01 NOTE — ED Notes (Signed)
Made dr aware of pt HR and runs of V-tach as well as repeating EKG

## 2017-10-01 NOTE — ED Provider Notes (Addendum)
[facility]-EMERGENCY DEPT Provider Note   CSN: [id number] Arrival date & time: 10/01/17  1636     History   Chief Complaint Chief Complaint  Patient presents with  . Edema    extremeties  . Pressure Ulcer    HPI Stacy Dorsey is a 72 y.o. female.  HPI  72 year old female with history of CVA, CHF, A. fib on Eliquis, recurrent UTIs comes in with chief complaint of worsening pitting edema. Patient was recently admitted for urosepsis.  Patient was discharged home and had home health come in and assessed today.  The family complained that patient was having difficulty in breathing yesterday, and worsening pitting edema therefore she was sent back to the ER.  Patient gets all of her medications by her daughters.  According to the daughters, patient was having chest pain and shortness of breath last night.  Patient does not have any history of OSA, but they were noticing that she was getting apneic.  Family is also concerned about UTI.  Patient is getting more agitated, and that is typically her presenting symptoms for UTI.  Patient has history of complicated UTIs.  Patient denies any abdominal pain/back pain.   Past Medical History:  Diagnosis Date  . CHF (congestive heart failure) ([facility])   . CVA (cerebral vascular accident) ([facility]): Hx of CVA 09/21/2017  . Diabetes mellitus without complication ([facility])   . Hypertension   . Paroxysmal A-fib [facility]): Hx of per hospitalization of 08/02/2017--[facility] 09/21/2017    Patient Active Problem List   Diagnosis Date Noted  . Paroxysmal A-fib ([facility]): Hx of per hospitalization of 08/02/2017[facility] 09/21/2017  . CVA (cerebral vascular accident) ([facility]): Hx of CVA 09/21/2017  . Pressure injury of skin 09/19/2017  . Acute cystitis without hematuria   . Acute encephalopathy 09/18/2017  . Acute lower UTI 09/18/2017  . CAD (coronary artery disease) 09/18/2017  . Cardiomyopathy (HCC) 09/18/2017  . Essential  hypertension 09/18/2017  . Sepsis (HCC) 09/17/2017    Past Surgical History:  Procedure Laterality Date  . CARPAL TUNNEL RELEASE    . CHOLECYSTECTOMY    . FEMUR SURGERY    . HERNIA REPAIR    . TUBAL LIGATION      OB History    No data available       Home Medications    Prior to Admission medications   Medication Sig Start Date End Date Taking? Authorizing Provider  allopurinol (ZYLOPRIM) 100 MG tablet Take 100 mg by mouth daily.  09/13/17  Yes [provider]  atorvastatin (LIPITOR) 80 MG tablet Take 80 mg by mouth daily.  09/13/17  Yes [provider]  diltiazem (CARDIZEM) 30 MG tablet Take 1 tablet (30 mg total) by mouth every 8 (eight) hours. 09/23/17  Yes Rodolph Bong, MD  dorzolamide (TRUSOPT) 2 % ophthalmic solution Place 1 drop into both eyes 3 (three) times daily.  09/13/17  Yes [provider]  ELIQUIS 5 MG TABS tablet Take 5 mg by mouth 2 (two) times daily.  09/13/17  Yes [provider]  Maltodextrin-Xanthan Gum (RESOURCE THICKENUP CLEAR) POWD Take 120 g by mouth as needed (nectar thick). 09/23/17  Yes Rodolph Bong, MD  MELATONIN PO Take 1 tablet by mouth at bedtime.   Yes [provider]  OLANZapine (ZYPREXA) 5 MG tablet Take 5 mg by mouth daily.  09/13/17  Yes [provider]  omeprazole (PRILOSEC) 20 MG capsule Take 20 mg by mouth 2 (two) times  daily before a meal.  09/13/17  Yes [provider]  QUEtiapine (SEROQUEL) 25 MG tablet Take 0.5 tablets (12.5 mg total) by mouth at bedtime. 09/23/17  Yes Rodolph Bonghompson, Daniel V, MD  nitroGLYCERIN (NITROSTAT) 0.4 MG SL tablet Place 0.4 mg under the tongue every 5 (five) minutes as needed for chest pain.  09/13/17   [provider]  NOVOLOG 100 UNIT/ML injection Inject 0-12 Units into the skin 4 (four) times daily. 0-150=0 units, 151-200=2 units, 201-250=4 units, 251-300=6 units, 301-350=8 units, 351-400=10 units, 401-450=12 units. 09/13/17   [provider]      Family History Family History  Problem Relation Age of Onset  . CAD Mother   . Cancer Father     Social History Social History   Tobacco Use  . Smoking status: Never Smoker  . Smokeless tobacco: Never Used  Substance Use Topics  . Alcohol use: No    Frequency: Never  . Drug use: No     Allergies   Allegra [fexofenadine]; Carvedilol; Sulfamethoxazole-trimethoprim; Sulfanilamide; Adhesive [tape]; Ceftriaxone; Cephalexin; Clindamycin/lincomycin; Clopidogrel; Ezetimibe; Haloperidol and related; Hydrocodone; Insulin degludec; Insulin glargine; Isosorbide mononitrate [isosorbide dinitrate er]; Moxifloxacin; Other; Prasugrel; Ramipril; Rosuvastatin; Tylenol [acetaminophen]; Atorvastatin calcium; and Minocycline   Review of Systems Review of Systems  Constitutional: Positive for activity change.  Respiratory: Positive for shortness of breath.   Cardiovascular: Positive for chest pain.  Gastrointestinal: Negative for abdominal pain.  Genitourinary: Negative for dysuria.  Skin: Negative for rash.  Hematological: Bruises/bleeds easily.  All other systems reviewed and are negative.    Physical Exam Updated Vital Signs BP (!) 118/53   Pulse (!) 120   Temp 97.6 F (36.4 C) (Oral)   Resp (!) 7   Ht 5\' 2"  (1.575 m)   Wt 90.7 kg (200 lb)   SpO2 97%   BMI 36.58 kg/m   Physical Exam  Constitutional: She is oriented to person, place, and time. She appears well-developed.  HENT:  Head: Normocephalic and atraumatic.  Eyes: EOM are normal.  Neck: Normal range of motion. Neck supple. No JVD present.  Cardiovascular: Normal rate.  Pulmonary/Chest: Effort normal and breath sounds normal. No respiratory distress.  Abdominal: Bowel sounds are normal. She exhibits no distension. There is no tenderness.  Musculoskeletal: She exhibits edema and deformity.  Neurological: She is alert and oriented to person, place, and time.  Skin: Skin is warm and dry.  Nursing note and vitals  reviewed.    ED Treatments / Results  Labs (all labs ordered are listed, but only abnormal results are displayed) Labs Reviewed  URINALYSIS, ROUTINE W REFLEX MICROSCOPIC - Abnormal; Notable for the following components:      Result Value   Color, Urine AMBER (*)    APPearance CLOUDY (*)    Bilirubin Urine SMALL (*)    Ketones, ur 5 (*)    Protein, ur 100 (*)    Leukocytes, UA MODERATE (*)    Bacteria, UA FEW (*)    Squamous Epithelial / LPF 0-5 (*)    All other components within normal limits  BASIC METABOLIC PANEL - Abnormal; Notable for the following components:   Glucose, Bld 136 (*)    Calcium 8.8 (*)    All other components within normal limits  CBC WITH DIFFERENTIAL/PLATELET - Abnormal; Notable for the following components:   WBC 11.1 (*)    RDW 20.9 (*)    Neutro Abs 8.7 (*)    All other components within normal limits  BRAIN NATRIURETIC PEPTIDE -  Abnormal; Notable for the following components:   B Natriuretic Peptide 729.1 (*)    All other components within normal limits  TROPONIN I - Abnormal; Notable for the following components:   Troponin I 0.04 (*)    All other components within normal limits  URINE CULTURE  HEPATIC FUNCTION PANEL    EKG  EKG Interpretation  Date/Time:  Tuesday October 01 2017 17:13:53 EDT Ventricular Rate:  119 PR Interval:    QRS Duration: 122 QT Interval:  347 QTC Calculation: 489 R Axis:   -71 Text Interpretation:  Sinus tachycardia IVCD, consider atypical RBBB Inferior infarct, old Anterior infarct, old Lateral leads are also involved No acute changes new twi in the anterior leads v1 and v2 might have been switched Confirmed by Derwood Kaplan [phone]) on 10/01/2017 5:34:21 PM       Radiology Dg Chest 2 View  Result Date: 10/01/2017 CLINICAL DATA:  Fluid retention EXAM: CHEST - 2 VIEW COMPARISON:  09/20/2017, 09/17/2016, 08/08/2017 FINDINGS: Small bilateral pleural effusions. Cardiomegaly with vascular congestion and bilateral  perihilar edema. Consolidations at both lung bases. No pneumothorax. IMPRESSION: 1. Small bilateral pleural effusions, not significantly changed 2. Cardiomegaly with vascular congestion and perihilar edema. Bibasilar atelectasis or pneumonia. Electronically Signed   By: Jasmine Pang M.D.   On: 10/01/2017 21:00    Procedures Procedures (including critical care time)  Medications Ordered in ED Medications  cefTRIAXone (ROCEPHIN) 1 g in sodium chloride 0.9 % 100 mL IVPB (not administered)  furosemide (LASIX) injection 40 mg (not administered)     Initial Impression / Assessment and Plan / ED Course  I have reviewed the triage vital signs and the nursing notes.  Pertinent labs & imaging results that were available during my care of the patient were reviewed by me and considered in my medical decision making (see chart for details).  Clinical Course as of Oct 01 2117  Tue Oct 01, 2017  1854 I did order a d-dimer for the patient, for rule out DVT.  However, the vascular tech was in the ER and therefore we moved forward with the ultrasound.  D-dimer is pending and not needed.  Please ignore the d-dimer results.  No clinical concerns for PE.  [AN]  2111 Chest x-ray is suspicious for CHF exacerbation. BNP is also elevated. I suspect that patient is having volume overload.  LFTs will be added to her labs -to see if she is having hypo-albumin status.    [AN]  2111 Patient is not allergic to ceftriaxone, she has side effects from it. We will give her antibiotics for questionable UTI.  Admitting team can follow-up on the cultures and de-escalate.  [AN]    Clinical Course User Index [AN] Derwood Kaplan, MD    72 year old female comes in with chief complaint of worsening edema of the legs, confusion.  Patient has history of CHF, A. fib on Eliquis, history of recurrent UTIs.  Patient is noted to have unilateral swelling over the right lower extremity, despite her being on Eliquis, pretest  probability for DVT is high enough every we will get a d-dimer.  Patient has history of CHF.  She is got lower extremity edema.  No JVD for me.  Lung exam is limited due to body habitus.  Chest x-ray ordered to see if there is any vascular congestion.  BNP and troponin also ordered.  Patient has history of CAD, and family reports that patient has complained of chest pain -troponin order.  Finally, patient does not  have any abdominal or pelvic pain on exam.  She has increased agitation, and there is history of UTI with similar symptoms.  UA ordered, and looks suspicious but will start her on antibiotics. Lactate ordered. We will reassess.  Final Clinical Impressions(s) / ED Diagnoses   Final diagnoses:  Lower urinary tract infectious disease  Acute congestive heart failure, unspecified heart failure type [facility])    ED Discharge Orders    None       Derwood Kaplan, MD 10/01/17 Julian Reil    Derwood Kaplan, MD 10/01/17 2115    Derwood Kaplan, MD 10/01/17 2119

## 2017-10-01 NOTE — ED Notes (Signed)
Date and time results received: 10/01/17 2030  (use smartphrase ".now" to insert current time)  Test: Troponin Critical Value: 0.04  Name of Provider Notified: Rhunette CroftNanavati

## 2017-10-01 NOTE — H&P (Signed)
History and Physical    Stacy Dorsey ZOX:[id number] DOB: Jun 09, 1946 DOA: 10/01/2017  Referring MD/NP/PA: Dr Soledad Gerlach PCP: Patient, No Pcp Per   Outpatient Specialists: at [facility]   Patient coming from: Home  Chief Complaint: increasing swelling of the legs  HPI: Stacy Dorsey is a 72 y.o. female with medical history significant of systolic dysfunction CHF who [facility] Heron Nay but was in the hospital on March 5 here with sepsis secondary to UTI. Patient was on diuretics at the time but due to hypotension was discharged off diuretics. She is back was progressive shortness of breath and swelling of the lower extremity. She is also some more confused than usual and family are worried about UTI due to history of persistent UTI. Patient has been declining since November of last year. She has had multiple hospitalizations at [facility] and this hospital. Has swelling of the legs has been progressive and all the way to her thigh. Associated with shortness of breath and cough. Patient has no fever or chills.She's has some dysuria however.  ED Course: patient evaluated in the ED with findings including proBNP of 729, troponin 0.04, white count is 11.1 urinalysis is positive for leukocyte Estrace moderate with WBCs and bacteria too numerous to count. Chest x-ray showed small pleural effusions bilaterally with cardiomegaly and pulmonary edema and possible bibasilar pneumonia  Review of Systems: As per HPI otherwise 10 point review of systems negative.    Past Medical History:  Diagnosis Date  . CHF (congestive heart failure) (HCC)   . CVA (cerebral vascular accident) (HCC): Hx of CVA 09/21/2017  . Diabetes mellitus without complication (HCC)   . Hypertension   . Paroxysmal A-fib [facility]): Hx of per hospitalization of 08/02/2017--[facility] 09/21/2017    Past Surgical History:  Procedure Laterality Date  . CARPAL TUNNEL RELEASE    .  CHOLECYSTECTOMY    . FEMUR SURGERY    . HERNIA REPAIR    . TUBAL LIGATION       reports that  has never smoked. she has never used smokeless tobacco. She reports that she does not drink alcohol or use drugs.  Allergies  Allergen Reactions  . Allegra [Fexofenadine] Anaphylaxis  . Carvedilol Anaphylaxis  . Sulfamethoxazole-Trimethoprim Anaphylaxis  . Sulfanilamide Anaphylaxis  . Adhesive [Tape]     Causes Blisters.  . Ceftriaxone Diarrhea  . Cephalexin Hives  . Clindamycin/Lincomycin Hives  . Clopidogrel   . Ezetimibe Nausea Only  . Haloperidol And Related Other (See Comments)    Confusion  . Hydrocodone Hives  . Insulin Degludec     Heart Flutters  . Insulin Glargine     Headache  . Isosorbide Mononitrate [Isosorbide Dinitrate Er]     Excessive sleep  . Moxifloxacin   . Other     Phenylpropanolamin-Hydrocodone  . Prasugrel     Bleeding  . Ramipril   . Rosuvastatin Hives    Causes her heart to race and pounding in the chest.  . Tylenol [Acetaminophen]   . Atorvastatin Calcium Palpitations  . Minocycline Rash    Family History  Problem Relation Age of Onset  . CAD Mother   . Cancer Father      Prior to Admission medications   Medication Sig Start Date End Date Taking? Authorizing Provider  allopurinol (ZYLOPRIM) 100 MG tablet Take 100 mg by mouth daily.  09/13/17  Yes [provider]  atorvastatin (LIPITOR) 80 MG tablet Take 80 mg by  mouth daily.  09/13/17  Yes [provider]  diltiazem (CARDIZEM) 30 MG tablet Take 1 tablet (30 mg total) by mouth every 8 (eight) hours. 09/23/17  Yes Rodolph Bong, MD  dorzolamide (TRUSOPT) 2 % ophthalmic solution Place 1 drop into both eyes 3 (three) times daily.  09/13/17  Yes [provider]  ELIQUIS 5 MG TABS tablet Take 5 mg by mouth 2 (two) times daily.  09/13/17  Yes [provider]  Maltodextrin-Xanthan Gum (RESOURCE THICKENUP CLEAR) POWD Take 120 g by mouth as needed (nectar thick).  09/23/17  Yes Rodolph Bong, MD  MELATONIN PO Take 1 tablet by mouth at bedtime.   Yes [provider]  OLANZapine (ZYPREXA) 5 MG tablet Take 5 mg by mouth daily.  09/13/17  Yes [provider]  omeprazole (PRILOSEC) 20 MG capsule Take 20 mg by mouth 2 (two) times daily before a meal.  09/13/17  Yes [provider]  QUEtiapine (SEROQUEL) 25 MG tablet Take 0.5 tablets (12.5 mg total) by mouth at bedtime. 09/23/17  Yes Rodolph Bong, MD  nitroGLYCERIN (NITROSTAT) 0.4 MG SL tablet Place 0.4 mg under the tongue every 5 (five) minutes as needed for chest pain.  09/13/17   [provider]  NOVOLOG 100 UNIT/ML injection Inject 0-12 Units into the skin 4 (four) times daily. 0-150=0 units, 151-200=2 units, 201-250=4 units, 251-300=6 units, 301-350=8 units, 351-400=10 units, 401-450=12 units. 09/13/17   [provider]    Physical Exam: Vitals:   10/01/17 1815 10/01/17 1830 10/01/17 1846 10/01/17 2100  BP: 110/90 115/68 115/68 (!) 118/53  Pulse:  (!) 117 (!) 118 (!) 120  Resp: 15 15 18  (!) 7  Temp:      TempSrc:      SpO2:  96% 97% 97%  Weight:      Height:          Constitutional: NAD, calm, comfortable Vitals:   10/01/17 1815 10/01/17 1830 10/01/17 1846 10/01/17 2100  BP: 110/90 115/68 115/68 (!) 118/53  Pulse:  (!) 117 (!) 118 (!) 120  Resp: 15 15 18  (!) 7  Temp:      TempSrc:      SpO2:  96% 97% 97%  Weight:      Height:       Eyes: PERRL, lids and conjunctivae normal ENMT: Mucous membranes are moist. Posterior pharynx clear of any exudate or lesions.Normal dentition.  Neck: normal, supple, no masses, no thyromegaly Respiratory: good air entry bilaterally but basal crackles no wheezes Cardiovascular: Regular rate and rhythm, no murmurs / rubs / gallops.3+ pedal edema 2+ pedal pulses. No carotid bruits.  Abdomen: no tenderness, no masses palpated. No hepatosplenomegaly. Bowel sounds positive.  Musculoskeletal: no clubbing / cyanosis. No  joint deformity upper and lower extremities. Good ROM, no contractures. Normal muscle tone.  Skin: no rashes, lesions, ulcers. No induration Neurologic: CN 2-12 grossly intact. Sensation intact, DTR normal. Strength 5/5 in all 4.  Psychiatric: dysarthric, impaired judgment and insight. Alert and oriented x 3. Slightly agitated     Labs on Admission: I have personally reviewed following labs and imaging studies  CBC: Recent Labs  Lab 10/01/17 1739  WBC 11.1*  NEUTROABS 8.7*  HGB 12.7  HCT 41.8  MCV 86.9  PLT 202   Basic Metabolic Panel: Recent Labs  Lab 10/01/17 1739  NA 142  K 4.8  CL 104  CO2 26  GLUCOSE 136*  BUN 11  CREATININE 0.74  CALCIUM 8.8*  GFR: Estimated Creatinine Clearance: 69.5 mL/min (by C-G formula based on SCr of 0.74 mg/dL). Liver Function Tests: No results for input(s): AST, ALT, ALKPHOS, BILITOT, PROT, ALBUMIN in the last 168 hours. No results for input(s): LIPASE, AMYLASE in the last 168 hours. No results for input(s): AMMONIA in the last 168 hours. Coagulation Profile: No results for input(s): INR, PROTIME in the last 168 hours. Cardiac Enzymes: Recent Labs  Lab 10/01/17 1739  TROPONINI 0.04*   BNP (last 3 results) No results for input(s): PROBNP in the last 8760 hours. HbA1C: No results for input(s): HGBA1C in the last 72 hours. CBG: No results for input(s): GLUCAP in the last 168 hours. Lipid Profile: No results for input(s): CHOL, HDL, LDLCALC, TRIG, CHOLHDL, LDLDIRECT in the last 72 hours. Thyroid Function Tests: No results for input(s): TSH, T4TOTAL, FREET4, T3FREE, THYROIDAB in the last 72 hours. Anemia Panel: No results for input(s): VITAMINB12, FOLATE, FERRITIN, TIBC, IRON, RETICCTPCT in the last 72 hours. Urine analysis:    Component Value Date/Time   COLORURINE AMBER (A) 10/01/2017 1723   APPEARANCEUR CLOUDY (A) 10/01/2017 1723   LABSPEC 1.025 10/01/2017 1723   PHURINE 5.0 10/01/2017 1723   GLUCOSEU NEGATIVE 10/01/2017  1723   HGBUR NEGATIVE 10/01/2017 1723   BILIRUBINUR SMALL (A) 10/01/2017 1723   KETONESUR 5 (A) 10/01/2017 1723   PROTEINUR 100 (A) 10/01/2017 1723   NITRITE NEGATIVE 10/01/2017 1723   LEUKOCYTESUR MODERATE (A) 10/01/2017 1723   Sepsis Labs: @LABRCNTIP (procalcitonin:4,lacticidven:4) )No results found for this or any previous visit (from the past 240 hour(s)).   Radiological Exams on Admission: Dg Chest 2 View  Result Date: 10/01/2017 CLINICAL DATA:  Fluid retention EXAM: CHEST - 2 VIEW COMPARISON:  09/20/2017, 09/17/2016, 08/08/2017 FINDINGS: Small bilateral pleural effusions. Cardiomegaly with vascular congestion and bilateral perihilar edema. Consolidations at both lung bases. No pneumothorax. IMPRESSION: 1. Small bilateral pleural effusions, not significantly changed 2. Cardiomegaly with vascular congestion and perihilar edema. Bibasilar atelectasis or pneumonia. Electronically Signed   By: Jasmine Pang M.D.   On: 10/01/2017 21:00    EKG: Independently reviewed. It shows sinus rhythm with ectopic beats right bundle branch block as well as old infarcts  Assessment/Plan Principal Problem:   CHF exacerbation (HCC) Active Problems:   Acute lower UTI   CAD (coronary artery disease)   Essential hypertension   Paroxysmal A-fib (HCC): Hx of per hospitalization of 08/02/2017--[facility]   CHF (congestive heart failure), NYHA class III, acute on chronic, combined (HCC)   #1 acute exacerbation of systolic dysfunction CHF: Family reported that patient's EF was 25% last year. No recent echocardiogram. Her symptoms may have been exacerbated after diuretic is where discontinued. I will start her on IV Lasix 40 bid grams twice a day, often near echocardiogram, continue her previous medications, we may have to discontinue Cardizem in the setting of systolic dysfunction. Consider cardiology consult if echocardiogram confirms severe systolic dysfunction.Family are thinking of possible comfort  measures if cardiac disease as well advanced.  #2 UTI: Patient has had a recurrent UTIs. Empirically treat with Rocephin while waiting for urine culture and sensitivities.  #3 paroxysmal atrial fibrillation: Patient is on anticoagulation and rate control. We may need to use beta blockers in place of Cardizem.  #4 coronary artery disease: Mild elevation of troponin most likely secondary to CHF. No evidence of Acute coronary syndrome.   DVT prophylaxis: Apixaban Code Status: DNR  Family Communication: Daughter at bedside Disposition Plan: To be determined. Consults called: None. Admission status: Inpatient.  Severity of Illness: The appropriate patient status for this patient is INPATIENT. Inpatient status is judged to be reasonable and necessary in order to provide the required intensity of service to ensure the patient's safety. The patient's presenting symptoms, physical exam findings, and initial radiographic and laboratory data in the context of their chronic comorbidities is felt to place them at high risk for further clinical deterioration. Furthermore, it is not anticipated that the patient will be medically stable for discharge from the hospital within 2 midnights of admission. The following factors support the patient status of inpatient.   " The patient's presenting symptoms include shortness of breath. " The worrisome physical exam findings include bilateral lower extremity edema. " The initial radiographic and laboratory data are worrisome because of x-ray findings of pulmonary edema. " The chronic co-morbidities include coronary artery disease and systolic CHF.   * I certify that at the point of admission it is my clinical judgment that the patient will require inpatient hospital care spanning beyond 2 midnights from the point of admission due to high intensity of service, high risk for further deterioration and high frequency of surveillance required.Lonia Blood*    Stacy Dorsey,LAWAL  MD [facility] Pager [phone]  If 7PM-7AM, please contact night-coverage [email or web address] Password [email or web address]  10/01/2017, 9:32 PM

## 2017-10-02 ENCOUNTER — Encounter (HOSPITAL_COMMUNITY): Payer: Self-pay

## 2017-10-02 ENCOUNTER — Other Ambulatory Visit: Payer: Self-pay

## 2017-10-02 ENCOUNTER — Inpatient Hospital Stay (HOSPITAL_COMMUNITY): Payer: Medicare Other

## 2017-10-02 DIAGNOSIS — I509 Heart failure, unspecified: Secondary | ICD-10-CM

## 2017-10-02 LAB — TSH: TSH: 4.397 u[IU]/mL (ref 0.350–4.500)

## 2017-10-02 LAB — TROPONIN I
Troponin I: 0.04 ng/mL (ref <0.03)
Troponin I: 0.07 ng/mL (ref <0.03)
Troponin I: 0.07 ng/mL (ref <0.03)

## 2017-10-02 LAB — GLUCOSE, CAPILLARY: Glucose-Capillary: 122 mg/dL — ABNORMAL HIGH (ref 65–99)

## 2017-10-02 MED ORDER — LORAZEPAM 2 MG/ML IJ SOLN
0.5000 mg | Freq: Once | INTRAMUSCULAR | Status: AC
  Administered 2017-10-02: 0.5 mg via INTRAVENOUS
  Filled 2017-10-02: qty 1

## 2017-10-02 MED ORDER — HALOPERIDOL LACTATE 5 MG/ML IJ SOLN
2.0000 mg | Freq: Four times a day (QID) | INTRAMUSCULAR | Status: DC | PRN
Start: 2017-10-02 — End: 2017-10-03
  Administered 2017-10-02 – 2017-10-03 (×2): 2 mg via INTRAVENOUS
  Filled 2017-10-02 (×2): qty 1

## 2017-10-02 MED ORDER — GERHARDT'S BUTT CREAM
TOPICAL_CREAM | Freq: Four times a day (QID) | CUTANEOUS | Status: DC
  Administered 2017-10-02 – 2017-10-06 (×14): via TOPICAL
  Administered 2017-10-06: 1 via TOPICAL
  Administered 2017-10-06 – 2017-10-08 (×9): via TOPICAL
  Filled 2017-10-02 (×2): qty 1

## 2017-10-02 MED ORDER — CEFTRIAXONE SODIUM 1 G IJ SOLR
1.0000 g | INTRAMUSCULAR | Status: DC
  Administered 2017-10-02 – 2017-10-05 (×4): 1 g via INTRAVENOUS
  Filled 2017-10-02 (×3): qty 1
  Filled 2017-10-02: qty 10
  Filled 2017-10-02: qty 1

## 2017-10-02 MED ORDER — JUVEN PO PACK
1.0000 | PACK | Freq: Two times a day (BID) | ORAL | Status: DC
  Administered 2017-10-03 – 2017-10-07 (×5): 1 via ORAL
  Filled 2017-10-02 (×15): qty 1

## 2017-10-02 NOTE — Progress Notes (Signed)
PROGRESS NOTE    Stacy Dorsey  RUE:[id number] DOB: 1945-11-27 DOA: 10/01/2017 PCP: Patient, No Pcp Per   Brief Narrative: Patient is a 71 year old female with past medical history of systolic CHF, recurrent UTI who presented from home with shortness of breath, swelling of lower extremity and confusion.  Patient was admitted for management of acute exacerbation of CHF and confusion secondary to urinary tract infection  Assessment & Plan:   Principal Problem:   CHF exacerbation (HCC) Active Problems:   Acute lower UTI   CAD (coronary artery disease)   Essential hypertension   Paroxysmal A-fib (HCC): Hx of per hospitalization of 08/02/2017--[facility]   CHF (congestive heart failure), NYHA class III, acute on chronic, combined (HCC)   Acute exacerbation of systolic CHF: Patient's ejection fraction was reported to be around 25% as per the echocardiogram done last year as per family. Chest x-ray showed small pleural effusions bilaterally with cardiomegaly and pulmonary edema and possible bibasilar pneumonia.  Started on diuretics.  She is on Lasix 40 mg twice a day.  Echocardiogram has been ordered, will follow the report.   She was not on Lasix at home because of hypotension.  UTI: Has history of recurrent UTIs in the past.  He was history of urinary tract infection.  Urine culture has been sent.  Started on Rocephin.  Delirium/confusion: Has history of confusion with UTI in the past. She also has H/O dementia. Patient was found to be confused and agitated this morning.  Continue her home medications.  Added Haldol on as needed basis for agitation. We will try to minimize environmental change.  We will keep the curtains open in the room for the light.    Paroxysmal A. fib: On Eliquis.  Rate is controlled.  She is on Cardizem.She follows with cardiology Dr. Bary Castilla at [facility].  Coronary artery disease: Mild elevation of troponin most likely secondary to CHF.  Denies any chest  pain.  No evidence of acute coronary syndrome.    DVT prophylaxis:Eliquis Code Status: Full Family Communication: None present at the bedside Disposition Plan: Home after resolution of CHF and confusion   Consultants: None  Procedures: None  Antimicrobials: Ceftriaxone since 10/01/17  Subjective: Patient seen and examined the bedside this morning.  She was slightly agitated and was shouting for help.  She is oriented just to place.  Does not look in distress  Objective: Vitals:   10/01/17 1846 10/01/17 2100 10/01/17 2256 10/02/17 0642  BP: 115/68 (!) 118/53 116/71 94/60  Pulse: (!) 118 (!) 120 (!) 121 (!) 102  Resp: 18 (!) 7 16 14   Temp:   100 F (37.8 C) 98.3 F (36.8 C)  TempSrc:   Oral Axillary  SpO2: 97% 97% 97% 99%  Weight:   84.7 kg (186 lb 11.7 oz)   Height:   5' (1.524 m)     Intake/Output Summary (Last 24 hours) at 10/02/2017 1229 Last data filed at 10/02/2017 0646 Gross per 24 hour  Intake 103 ml  Output 600 ml  Net -497 ml   Filed Weights   10/01/17 1710 10/01/17 1718 10/01/17 2256  Weight: 90.7 kg (200 lb) 90.7 kg (200 lb) 84.7 kg (186 lb 11.7 oz)    Examination:  General exam: Not in distress,average built,confused HEENT:PERRL,Oral mucosa moist, Ear/Nose normal on gross exam Respiratory system: Bilateral equal air entry, normal vesicular breath sounds, no wheezes or crackles  Cardiovascular system: S1 & S2 heard, RRR. No JVD, murmurs, rubs, gallops or clicks. No  pedal edema. Gastrointestinal system: Abdomen is nondistended, soft and nontender. No organomegaly or masses felt. Normal bowel sounds heard. Central nervous system: Alert but not  oriented. No focal neurological deficits. Extremities: No edema, no clubbing ,no cyanosis, distal peripheral pulses palpable. Skin: No rashes, lesions or ulcers,no icterus ,no pallor MSK: Normal muscle bulk,tone ,power    Data Reviewed: I have personally reviewed following labs and imaging  studies  CBC: Recent Labs  Lab 10/01/17 1739  WBC 11.1*  NEUTROABS 8.7*  HGB 12.7  HCT 41.8  MCV 86.9  PLT 202   Basic Metabolic Panel: Recent Labs  Lab 10/01/17 1739  NA 142  K 4.8  CL 104  CO2 26  GLUCOSE 136*  BUN 11  CREATININE 0.74  CALCIUM 8.8*   GFR: Estimated Creatinine Clearance: 64.1 mL/min (by C-G formula based on SCr of 0.74 mg/dL). Liver Function Tests: Recent Labs  Lab 10/01/17 1909  AST 46*  ALT 14  ALKPHOS 144*  BILITOT 1.2  PROT 6.3*  ALBUMIN 2.5*   No results for input(s): LIPASE, AMYLASE in the last 168 hours. No results for input(s): AMMONIA in the last 168 hours. Coagulation Profile: No results for input(s): INR, PROTIME in the last 168 hours. Cardiac Enzymes: Recent Labs  Lab 10/01/17 1739 10/02/17 0009  TROPONINI 0.04* 0.04*   BNP (last 3 results) No results for input(s): PROBNP in the last 8760 hours. HbA1C: No results for input(s): HGBA1C in the last 72 hours. CBG: Recent Labs  Lab 10/02/17 0021  GLUCAP 122*   Lipid Profile: No results for input(s): CHOL, HDL, LDLCALC, TRIG, CHOLHDL, LDLDIRECT in the last 72 hours. Thyroid Function Tests: No results for input(s): TSH, T4TOTAL, FREET4, T3FREE, THYROIDAB in the last 72 hours. Anemia Panel: No results for input(s): VITAMINB12, FOLATE, FERRITIN, TIBC, IRON, RETICCTPCT in the last 72 hours. Sepsis Labs: No results for input(s): PROCALCITON, LATICACIDVEN in the last 168 hours.  No results found for this or any previous visit (from the past 240 hour(s)).       Radiology Studies: Dg Chest 2 View  Result Date: 10/01/2017 CLINICAL DATA:  Fluid retention EXAM: CHEST - 2 VIEW COMPARISON:  09/20/2017, 09/17/2016, 08/08/2017 FINDINGS: Small bilateral pleural effusions. Cardiomegaly with vascular congestion and bilateral perihilar edema. Consolidations at both lung bases. No pneumothorax. IMPRESSION: 1. Small bilateral pleural effusions, not significantly changed 2. Cardiomegaly  with vascular congestion and perihilar edema. Bibasilar atelectasis or pneumonia. Electronically Signed   By: Jasmine PangKim  Fujinaga M.D.   On: 10/01/2017 21:00        Scheduled Meds: . allopurinol  100 mg Oral Daily  . apixaban  5 mg Oral BID  . atorvastatin  80 mg Oral q1800  . diltiazem  30 mg Oral Q8H  . docusate sodium  100 mg Oral BID  . dorzolamide  1 drop Both Eyes TID  . furosemide  40 mg Intravenous BID  . Gerhardt's butt cream   Topical QID  . insulin aspart  0-12 Units Subcutaneous QID  . mouth rinse  15 mL Mouth Rinse BID  . Melatonin  1 mg Oral QHS  . nutrition supplement (JUVEN)  1 packet Oral BID BM  . OLANZapine  5 mg Oral Daily  . pantoprazole  40 mg Oral Daily  . QUEtiapine  12.5 mg Oral QHS  . sodium chloride flush  3 mL Intravenous Q12H   Continuous Infusions: . sodium chloride    . cefTRIAXone (ROCEPHIN)  IV       LOS:  1 day    Time spent: More than 50% of that time was spent in counseling and/or coordination of care.      Burnadette Pop, MD [facility] Pager [phone]  If 7PM-7AM, please contact night-coverage [email or web address] Password [facility] 10/02/2017, 12:29 PM

## 2017-10-02 NOTE — Plan of Care (Signed)
  Progressing Pain Managment: General experience of comfort will improve 10/02/2017 0132 - Progressing by Cristela FeltSteffens, Shifa Brisbon P, RN Safety: Ability to remain free from injury will improve 10/02/2017 0132 - Progressing by Cristela FeltSteffens, Abdulahad Mederos P, RN

## 2017-10-02 NOTE — Progress Notes (Signed)
Initial Nutrition Assessment  DOCUMENTATION CODES:   Obesity unspecified  INTERVENTION:   1 packet Juven BID, each packet provides 80 calories, 8 grams of carbohydrate, and 14 grams of amino acids; supplement contains CaHMB, glutamine, and arginine, to promote wound healing  NUTRITION DIAGNOSIS:   Increased nutrient needs related to wound healing as evidenced by estimated needs  GOAL:   Patient will meet greater than or equal to 90% of their needs  MONITOR:   PO intake, Supplement acceptance, Labs, Weight trends, I & O's  REASON FOR ASSESSMENT:   Low Braden   ASSESSMENT:   Pt with PMH of DM, CHF, HTN, and CAD presents with SOB and cough; found acute CHF exacerbation   Attempted to arouse pt multiple times, pt unable to answer RD questions at visit.   Pt with hx of CHF, 3 lb weight loss in 2 week period likely related to fluid status  No meal completion records available at this time.  Pt with multiple wounds would benefit from nutritional supplement to aid in wound healing, RD to order.   Labs reviewed; CBG 122-185, Albumin 2.5 Medications reviewed; Colace, Protonix, sliding scale insulin, Melatonin, Eliquis    NUTRITION - FOCUSED PHYSICAL EXAM:    Most Recent Value  Orbital Region  No depletion  Upper Arm Region  No depletion  Thoracic and Lumbar Region  No depletion  Buccal Region  No depletion  Temple Region  Mild depletion  Clavicle Bone Region  No depletion  Clavicle and Acromion Bone Region  No depletion  Scapular Bone Region  No depletion  Dorsal Hand  No depletion  Patellar Region  No depletion  Anterior Thigh Region  No depletion  Posterior Calf Region  No depletion  Edema (RD Assessment)  Moderate      Diet Order:  Diet heart healthy/carb modified Room service appropriate? Yes; Fluid consistency: Thin  EDUCATION NEEDS:   Not appropriate for education at this time  Skin:  Skin Assessment: Skin Integrity Issues: Skin Integrity Issues:: DTI,  Stage II DTI: heel Stage II: sacrum  Last BM:  Unknown BM date  Height:   Ht Readings from Last 1 Encounters:  10/01/17 5' (1.524 m)   Weight:   Wt Readings from Last 1 Encounters:  10/01/17 186 lb 11.7 oz (84.7 kg)   Ideal Body Weight:  45.45 kg  BMI:  Body mass index is 36.47 kg/m.  Estimated Nutritional Needs:   Kcal:  1800-2000  Protein:  95-105 grams  Fluid:  urine output + 1000 mL  Stacy KaufmannAllison Ioannides, MS, RDN, LDN 10/02/2017 11:48 AM

## 2017-10-02 NOTE — Progress Notes (Signed)
Exchanged bed to airflow bed as ordered. Pt tol well, cleaned skin and applied creamed. SRP, RN

## 2017-10-02 NOTE — Progress Notes (Signed)
  Echocardiogram 2D Echocardiogram has been attempted.Patient refused exam. Patient stated to come back tomorrow.  Laurin Morgenstern G Neyland Pettengill 10/02/2017, 3:17 PM

## 2017-10-02 NOTE — Consult Note (Signed)
WOC Nurse wound consult note Reason for Consult:moisture associated skin damage, specifically, intertriginous dermatitis. No pressure injury found. Wound type:moisture Pressure Injury POA: NA Measurement: N/A Wound WUJ:[id number]:partial thickness skin loss in the perineal area, scattered.  Pink, moist lesions Drainage (amount, consistency, odor) scant serous Periwound:bright red with confluent center and satellite lesions consistent with yeast/fungal overgrowth. Dressing procedure/placement/frequency:I will place patient on a therapeutic sleep surface with low air loss feature, Prevalon pressure redistribution heel boots for pressure injury prevention and will initiate a zinc based antifungal cream to the affected areas 4 times daily. A systemic antifungal (e.g. Diflucan) would provide in a more expedient resolution of the fungal rash.  If you agree, please order. An external urinary device is in place to divert urinary incontinence. WOC nursing team will not follow, but will remain available to this patient, the nursing and medical teams.  Please re-consult if needed. Thanks, Ladona MowLaurie Kipling Graser, MSN, RN, GNP, Hans EdenCWOCN, CWON-AP, FAAN  Pager# [phone]

## 2017-10-03 ENCOUNTER — Inpatient Hospital Stay (HOSPITAL_COMMUNITY): Payer: Medicare Other

## 2017-10-03 DIAGNOSIS — F05 Delirium due to known physiological condition: Secondary | ICD-10-CM

## 2017-10-03 DIAGNOSIS — I340 Nonrheumatic mitral (valve) insufficiency: Secondary | ICD-10-CM

## 2017-10-03 LAB — URINE CULTURE: CULTURE: NO GROWTH

## 2017-10-03 LAB — ECHOCARDIOGRAM COMPLETE
Height: 60 in
WEIGHTICAEL: 2987.67 [oz_av]

## 2017-10-03 LAB — GLUCOSE, CAPILLARY
Glucose-Capillary: 94 mg/dL (ref 65–99)
Glucose-Capillary: 83 mg/dL (ref 65–99)
Glucose-Capillary: 85 mg/dL (ref 65–99)

## 2017-10-03 MED ORDER — FUROSEMIDE 10 MG/ML IJ SOLN: 40.0000 mg | Freq: Every day | INTRAMUSCULAR | Status: DC

## 2017-10-03 MED ORDER — LORAZEPAM 2 MG/ML IJ SOLN
1.0000 mg | Freq: Once | INTRAMUSCULAR | Status: AC
Start: 2017-10-03 — End: 2017-10-03
  Administered 2017-10-03: 1 mg via INTRAVENOUS
  Filled 2017-10-03: qty 1

## 2017-10-03 NOTE — Progress Notes (Signed)
  Echocardiogram 2D Echocardiogram has been performed.  Dorena Dewiffany G Zannie Locastro 10/03/2017, 3:47 PM

## 2017-10-03 NOTE — Progress Notes (Signed)
Pt continuing to holler even with comfort sitter at bedside. Pt refused all PO medication mixed in ice cream and refused to let us check her HS blood sugar. Day shift RN stated she tried to keep pt awake all day in hopes that pt would sleep at night.

## 2017-10-03 NOTE — Progress Notes (Signed)
   10/03/17 0931  Clinical Encounter Type  Visited With Patient;Health care provider  Visit Type Follow-up  Spiritual Encounters  Spiritual Needs Emotional;Prayer   While rounding on 4 I came across this patient when she was on this floor with a prior admission.  Patient was alone and son-in-law had been by earlier.  Patient had moments of knowing but of not knowing where she is or what is happening.  She seems very anxious and trying to keep herself awake.  We prayed together and I encouraged her to close her eyes and try to rest.  Will follow and support as needed. Chaplain Agustin CreeNewton Davin Archuletta

## 2017-10-03 NOTE — Progress Notes (Signed)
PROGRESS NOTE    Stacy ShorterJanice Dorsey  WUJ:[id number] DOB: 07-10-1946 DOA: 10/01/2017 PCP: Patient, No Pcp Per   Brief Narrative: Patient is a 72 year old female with past medical history of systolic CHF, recurrent UTI who presented from home with shortness of breath, swelling of lower extremity and confusion.  Patient was admitted for management of acute exacerbation of CHF and confusion secondary to urinary tract infection  Assessment & Plan:   Principal Problem:   CHF exacerbation (HCC) Active Problems:   Acute lower UTI   CAD (coronary artery disease)   Essential hypertension   Paroxysmal A-fib (HCC): Hx of per hospitalization of 08/02/2017--[facility]   CHF (congestive heart failure), NYHA class III, acute on chronic, combined (HCC)   Acute exacerbation of systolic CHF: Patient's ejection fraction was reported to be around 25% as per the echocardiogram done last year as per family. Chest x-ray showed small pleural effusions bilaterally with cardiomegaly and pulmonary edema and possible bibasilar pneumonia.  Started on diuretics.  She is on Lasix 40 mg twice a day which has been changed to once daily. Echocardiogram has been ordered, will follow the report.   She was not on Lasix at home because of hypotension. She follows with Dr. Bary CastillaKalil as an outpatient.  UTI: Has history of recurrent UTIs in the past.  UA suggestive of UTI here.  Urine culture does not show growth. On Rocephin.  Delirium/confusion: Has history of confusion with UTI in the past. She also has H/O dementia. Patient still found to be confused and agitated this morning.  Continue her home medications. On Zyprexa and quetiapine. We will try to minimize environmental change.  We will keep the curtains open in the room for the light.   She should follow-up with neurology as an outpatient for the management of her dementia.  Paroxysmal A. fib: On Eliquis.  Rate is controlled.  She is on Cardizem.She follows with  cardiology Dr. Bary [facility]il [facility].We will change cardizem to beta blocker after reviewing the Echo report.  Coronary artery disease: Mild elevation of troponin most likely secondary to CHF.  Denies any chest pain.  No evidence of acute coronary syndrome.    DVT prophylaxis:Eliquis Code Status: Full Family Communication: Discussed with her daughter on phone Disposition Plan: Home after resolution of CHF and confusion   Consultants: None  Procedures: None  Antimicrobials: Ceftriaxone since 10/01/17  Subjective: Patient seen and examined the bedside this morning.  She was given Haldol after she was agitated this morning.  Objective: Vitals:   10/02/17 0642 10/02/17 1500 10/02/17 2019 10/03/17 0627  BP: 94/60 (!) 96/55 (!) 111/56 (!) 90/57  Pulse: (!) 102 (!) 112 (!) 105 99  Resp: 14 16 16 16   Temp: 98.3 F (36.8 C) 98.6 F (37 C) 98.4 F (36.9 C) 98.4 F (36.9 C)  TempSrc: Axillary Axillary Axillary Axillary  SpO2: 99% 98% 95% 100%  Weight:      Height:        Intake/Output Summary (Last 24 hours) at 10/03/2017 1233 Last data filed at 10/03/2017 0048 Gross per 24 hour  Intake 100 ml  Output 200 ml  Net -100 ml   Filed Weights   10/01/17 1710 10/01/17 1718 10/01/17 2256  Weight: 90.7 kg (200 lb) 90.7 kg (200 lb) 84.7 kg (186 lb 11.7 oz)    Examination:  General exam: Appears calm and comfortable ,Not in distress,average built,confused HEENT:PERRL,Oral mucosa moist, Ear/Nose normal on gross exam Respiratory system: Bilateral equal air entry, normal  vesicular breath sounds, no wheezes or crackles  Cardiovascular system: S1 & S2 heard, RRR. No JVD, murmurs, rubs, gallops or clicks. Gastrointestinal system: Abdomen is nondistended, soft and nontender. No organomegaly or masses felt. Normal bowel sounds heard. Central nervous system: Alert but not  oriented. No focal neurological deficits. Extremities: Trace edema, no clubbing ,no cyanosis, distal peripheral pulses  palpable. Skin: No rashes, lesions or ulcers,no icterus ,no pallor MSK: Normal muscle bulk,tone ,power     Data Reviewed: I have personally reviewed following labs and imaging studies  CBC: Recent Labs  Lab 10/01/17 1739  WBC 11.1*  NEUTROABS 8.7*  HGB 12.7  HCT 41.8  MCV 86.9  PLT 202   Basic Metabolic Panel: Recent Labs  Lab 10/01/17 1739  NA 142  K 4.8  CL 104  CO2 26  GLUCOSE 136*  BUN 11  CREATININE 0.74  CALCIUM 8.8*   GFR: Estimated Creatinine Clearance: 64.1 mL/min (by C-G formula based on SCr of 0.74 mg/dL). Liver Function Tests: Recent Labs  Lab 10/01/17 1909  AST 46*  ALT 14  ALKPHOS 144*  BILITOT 1.2  PROT 6.3*  ALBUMIN 2.5*   No results for input(s): LIPASE, AMYLASE in the last 168 hours. No results for input(s): AMMONIA in the last 168 hours. Coagulation Profile: No results for input(s): INR, PROTIME in the last 168 hours. Cardiac Enzymes: Recent Labs  Lab 10/01/17 1739 10/02/17 0009 10/02/17 1443  TROPONINI 0.04* 0.04* 0.07*  0.07*   BNP (last 3 results) No results for input(s): PROBNP in the last 8760 hours. HbA1C: No results for input(s): HGBA1C in the last 72 hours. CBG: Recent Labs  Lab 10/02/17 0021 10/03/17 1147  GLUCAP 122* 94   Lipid Profile: No results for input(s): CHOL, HDL, LDLCALC, TRIG, CHOLHDL, LDLDIRECT in the last 72 hours. Thyroid Function Tests: Recent Labs    10/02/17 1443  TSH 4.397   Anemia Panel: No results for input(s): VITAMINB12, FOLATE, FERRITIN, TIBC, IRON, RETICCTPCT in the last 72 hours. Sepsis Labs: No results for input(s): PROCALCITON, LATICACIDVEN in the last 168 hours.  Recent Results (from the past 240 hour(s))  Urine culture     Status: None   Collection Time: 10/01/17  5:23 PM  Result Value Ref Range Status   Specimen Description   Final    URINE, RANDOM Performed at [facility], [address], [place], [place] [address]    Special Requests   Final     NONE Performed at [facility], [address] [place], [place] [address]    Culture   Final    NO GROWTH Performed at [facility], [address] [place], [place] [address]    Report Status 10/03/2017 FINAL  Final         Radiology Studies: Dg Chest 2 View  Result Date: 10/01/2017 CLINICAL DATA:  Fluid retention EXAM: CHEST - 2 VIEW COMPARISON:  09/20/2017, 09/17/2016, 08/08/2017 FINDINGS: Small bilateral pleural effusions. Cardiomegaly with vascular congestion and bilateral perihilar edema. Consolidations at both lung bases. No pneumothorax. IMPRESSION: 1. Small bilateral pleural effusions, not significantly changed 2. Cardiomegaly with vascular congestion and perihilar edema. Bibasilar atelectasis or pneumonia. Electronically Signed   By: Jasmine Pang M.D.   On: 10/01/2017 21:00        Scheduled Meds: . allopurinol  100 mg Oral Daily  . apixaban  5 mg Oral BID  . atorvastatin  80 mg Oral q1800  . diltiazem  30 mg Oral Q8H  .  docusate sodium  100 mg Oral BID  . dorzolamide  1 drop Both Eyes TID  . [START ON 10/04/2017] furosemide  40 mg Intravenous Daily  . Gerhardt's butt cream   Topical QID  . insulin aspart  0-12 Units Subcutaneous QID  . mouth rinse  15 mL Mouth Rinse BID  . Melatonin  1 mg Oral QHS  . nutrition supplement (JUVEN)  1 packet Oral BID BM  . OLANZapine  5 mg Oral Daily  . pantoprazole  40 mg Oral Daily  . QUEtiapine  12.5 mg Oral QHS  . sodium chloride flush  3 mL Intravenous Q12H   Continuous Infusions: . sodium chloride    . cefTRIAXone (ROCEPHIN)  IV Stopped (10/02/17 2347)     LOS: 2 days    Time spent: More than 50% of that time was spent in counseling and/or coordination of care.      Burnadette Pop, MD [facility] Pager [phone]  If 7PM-7AM, please contact night-coverage [email or web address] Password [facility] 10/03/2017, 12:33 PM

## 2017-10-04 DIAGNOSIS — I5043 Acute on chronic combined systolic (congestive) and diastolic (congestive) heart failure: Secondary | ICD-10-CM

## 2017-10-04 DIAGNOSIS — I429 Cardiomyopathy, unspecified: Secondary | ICD-10-CM

## 2017-10-04 DIAGNOSIS — R4587 Impulsiveness: Secondary | ICD-10-CM

## 2017-10-04 DIAGNOSIS — F05 Delirium due to known physiological condition: Secondary | ICD-10-CM

## 2017-10-04 DIAGNOSIS — R410 Disorientation, unspecified: Secondary | ICD-10-CM

## 2017-10-04 DIAGNOSIS — N390 Urinary tract infection, site not specified: Secondary | ICD-10-CM

## 2017-10-04 LAB — GLUCOSE, CAPILLARY
GLUCOSE-CAPILLARY: 93 mg/dL (ref 65–99)
Glucose-Capillary: 96 mg/dL (ref 65–99)
Glucose-Capillary: 89 mg/dL (ref 65–99)
Glucose-Capillary: 97 mg/dL (ref 65–99)

## 2017-10-04 LAB — BASIC METABOLIC PANEL
Anion gap: 12 (ref 5–15)
BUN: 6 mg/dL (ref 6–20)
CO2: 34 mmol/L — AB (ref 22–32)
Calcium: 8.1 mg/dL — ABNORMAL LOW (ref 8.9–10.3)
Chloride: 95 mmol/L — ABNORMAL LOW (ref 101–111)
Creatinine, Ser: 0.6 mg/dL (ref 0.44–1.00)
GFR calc Af Amer: >60 mL/min (ref >60)
GLUCOSE: 85 mg/dL (ref 65–99)
Potassium: 2.6 mmol/L — CL (ref 3.5–5.1)
Sodium: 141 mmol/L (ref 135–145)

## 2017-10-04 LAB — CBC WITH DIFFERENTIAL/PLATELET
Basophils Absolute: 0 10*3/uL (ref 0.0–0.1)
Basophils Relative: 0 %
EOS PCT: 1 %
Eosinophils Absolute: 0.1 10*3/uL (ref 0.0–0.7)
HEMATOCRIT: 43.6 % (ref 36.0–46.0)
Hemoglobin: 14.1 g/dL (ref 12.0–15.0)
LYMPHS ABS: 2.1 10*3/uL (ref 0.7–4.0)
LYMPHS PCT: 25 %
MCH: 27 pg (ref 26.0–34.0)
MCHC: 32.3 g/dL (ref 30.0–36.0)
MCV: 83.5 fL (ref 78.0–100.0)
MONO ABS: 1 10*3/uL (ref 0.1–1.0)
Monocytes Relative: 12 %
NEUTROS ABS: 5.3 10*3/uL (ref 1.7–7.7)
Neutrophils Relative %: 62 %
PLATELETS: 134 10*3/uL — AB (ref 150–400)
RBC: 5.22 MIL/uL — AB (ref 3.87–5.11)
RDW: 20.6 % — AB (ref 11.5–15.5)
WBC: 8.6 10*3/uL (ref 4.0–10.5)

## 2017-10-04 MED ORDER — LORAZEPAM 2 MG/ML IJ SOLN
0.5000 mg | Freq: Once | INTRAMUSCULAR | Status: AC
  Administered 2017-10-05: 0.5 mg via INTRAVENOUS
  Filled 2017-10-04: qty 1

## 2017-10-04 MED ORDER — OLANZAPINE 5 MG PO TABS
2.5000 mg | ORAL_TABLET | Freq: Every day | ORAL | Status: DC
  Administered 2017-10-05 – 2017-10-07 (×3): 2.5 mg via ORAL
  Filled 2017-10-04 (×4): qty 1

## 2017-10-04 MED ORDER — POTASSIUM CHLORIDE 10 MEQ/100ML IV SOLN
10.0000 meq | INTRAVENOUS | Status: AC
  Administered 2017-10-04 (×6): 10 meq via INTRAVENOUS
  Filled 2017-10-04 (×6): qty 100

## 2017-10-04 MED ORDER — IBUPROFEN 200 MG PO TABS
600.0000 mg | ORAL_TABLET | Freq: Once | ORAL | Status: AC
  Administered 2017-10-05: 600 mg via ORAL
  Filled 2017-10-04: qty 3

## 2017-10-04 MED ORDER — OLANZAPINE 5 MG PO TABS
5.0000 mg | ORAL_TABLET | Freq: Every day | ORAL | Status: DC
  Administered 2017-10-04 – 2017-10-09 (×4): 5 mg via ORAL
  Filled 2017-10-04 (×4): qty 1

## 2017-10-04 MED ORDER — FUROSEMIDE 40 MG PO TABS: 40.0000 mg | ORAL_TABLET | Freq: Every day | ORAL | Status: DC

## 2017-10-04 MED ORDER — ACETAMINOPHEN 325 MG PO TABS: 650.0000 mg | ORAL_TABLET | Freq: Four times a day (QID) | ORAL | Status: DC | PRN

## 2017-10-04 MED ORDER — FUROSEMIDE 10 MG/ML IJ SOLN
40.0000 mg | Freq: Every day | INTRAMUSCULAR | Status: DC
  Administered 2017-10-04: 40 mg via INTRAVENOUS
  Filled 2017-10-04 (×2): qty 4

## 2017-10-04 MED ORDER — LORAZEPAM 0.5 MG PO TABS: 0.5000 mg | ORAL_TABLET | Freq: Every day | ORAL | Status: DC

## 2017-10-04 NOTE — Consult Note (Addendum)
[facility] Consult   Reason for Consult:  Agitation Referring Physician:  Dr. Tawanna Solo Patient Identification: Stacy Dorsey MRN:  [id number] Principal Diagnosis: Delirium due to another medical condition Diagnosis:   Patient Active Problem List   Diagnosis Date Noted  . Confusion [R41.0] 10/03/2017  . CHF (congestive heart failure), NYHA class III, acute on chronic, combined ([place]) [I50.43] 10/01/2017  . Paroxysmal A-fib ([place]): Hx of per hospitalization of 08/02/2017--[facility] [I48.0] 09/21/2017  . CVA (cerebral vascular accident) [facility]): Hx of CVA [I63.9] 09/21/2017  . Pressure injury of skin [L89.90] 09/19/2017  . Acute cystitis without hematuria [N30.00]   . Acute encephalopathy [G93.40] 09/18/2017  . Acute lower UTI [N39.0] 09/18/2017  . CAD (coronary artery disease) [I25.10] 09/18/2017  . CHF exacerbation ([place]) [I42.9] 09/18/2017  . Essential hypertension [I10] 09/18/2017  . Sepsis ([place]) [A41.9] 09/17/2017    Total Time spent with patient: 1 hour  Subjective:   Stacy Dorsey is a 72 y.o. female patient admitted with acute exacerbation of CHF and AMS secondary to UTI.  HPI:   Per chart review, patient was admitted for acute exacerbation of systolic CHF and UTI. She has been agitated and confused. Nursing report that she is yelling in distress and resisting care due to paranoia about being harmed. She was given Ativan 1 mg overnight and Haldol 2 mg yesterday morning. She is prescribed Melatonin 1 mg qhs, Zyprexa 5 mg daily and Seroquel 12.5 mg qhs.   Stacy Dorsey is disoriented and confused on interview. She reports that she is not in a hospital. She does not know the date but believes it is April. She denies SI or HI. She reports, "Y'all go ahead. I'll be okay" when she is asked questions. She denies having pain but reportedly states, "Help help help."   Past Psychiatric History: Dementia  Risk to Self: Is patient at risk for suicide?: No Risk to Others:   None. Denies HI. Prior Inpatient Therapy:  Unknown due to AMS.  Prior Outpatient Therapy:  Unknown due to AMS.   Past Medical History:  Past Medical History:  Diagnosis Date  . CHF (congestive heart failure) ([place])   . CVA (cerebral vascular accident) ([place]): Hx of CVA 09/21/2017  . Diabetes mellitus without complication ([place])   . Hypertension   . Paroxysmal A-fib [facility]): Hx of per hospitalization of 08/02/2017--[facility] 09/21/2017    Past Surgical History:  Procedure Laterality Date  . CARPAL TUNNEL RELEASE    . CHOLECYSTECTOMY    . FEMUR SURGERY    . HERNIA REPAIR    . TUBAL LIGATION     Family History:  Family History  Problem Relation Age of Onset  . CAD Mother   . Cancer Father    Family Psychiatric  History: Unknown  Social History:  Social History   Substance and Sexual Activity  Alcohol Use No  . Frequency: Never     Social History   Substance and Sexual Activity  Drug Use No    Social History   Socioeconomic History  . Marital status: Legally Separated    Spouse name: Not on file  . Number of children: Not on file  . Years of education: Not on file  . Highest education level: Not on file  Occupational History  . Not on file  Social Needs  . Financial resource strain: Not on file  . Food insecurity:    Worry: Not on file    Inability: Not on file  . Transportation  needs:    Medical: Not on file    Non-medical: Not on file  Tobacco Use  . Smoking status: Never Smoker  . Smokeless tobacco: Never Used  Substance and Sexual Activity  . Alcohol use: No    Frequency: Never  . Drug use: No  . Sexual activity: Not on file  Lifestyle  . Physical activity:    Days per week: Not on file    Minutes per session: Not on file  . Stress: Not on file  Relationships  . Social connections:    Talks on phone: Not on file    Gets together: Not on file    Attends religious service: Not on file    Active member of club or organization: Not on file     Attends meetings of clubs or organizations: Not on file    Relationship status: Not on file  Other Topics Concern  . Not on file  Social History Narrative  . Not on file   Additional Social History: She lives at home with her daughter.     Allergies:   Allergies  Allergen Reactions  . Allegra [Fexofenadine] Anaphylaxis  . Carvedilol Anaphylaxis  . Sulfamethoxazole-Trimethoprim Anaphylaxis  . Sulfanilamide Anaphylaxis  . Adhesive [Tape]     Causes Blisters.  . Ceftriaxone Diarrhea  . Cephalexin Hives  . Clindamycin/Lincomycin Hives  . Clopidogrel   . Ezetimibe Nausea Only  . Haloperidol And Related Other (See Comments)    Confusion  . Hydrocodone Hives  . Insulin Degludec     Heart Flutters  . Insulin Glargine     Headache  . Isosorbide Mononitrate [Isosorbide Dinitrate Er]     Excessive sleep  . Moxifloxacin   . Other     Phenylpropanolamin-Hydrocodone  . Prasugrel     Bleeding  . Ramipril   . Rosuvastatin Hives    Causes her heart to race and pounding in the chest.  . Tylenol [Acetaminophen]   . Atorvastatin Calcium Palpitations  . Minocycline Rash    Labs:  Results for orders placed or performed during the hospital encounter of 10/01/17 (from the past 48 hour(s))  TSH     Status: None   Collection Time: 10/02/17  2:43 PM  Result Value Ref Range   TSH 4.397 0.350 - 4.500 uIU/mL    Comment: Performed by a 3rd Generation assay with a functional sensitivity of <=0.01 uIU/mL. Performed at [facility], [place] [address] [place], [place] [address]   Troponin I     Status: Abnormal   Collection Time: 10/02/17  2:43 PM  Result Value Ref Range   Troponin I 0.07 (HH) <0.03 ng/mL    Comment: CRITICAL VALUE NOTED.  VALUE IS CONSISTENT WITH PREVIOUSLY REPORTED AND CALLED VALUE. Performed at [facility], [place] [address] [place], [place] [address]   Troponin I     Status: Abnormal   Collection Time: 10/02/17  2:43 PM  Result  Value Ref Range   Troponin I 0.07 (HH) <0.03 ng/mL    Comment: CRITICAL VALUE NOTED.  VALUE IS CONSISTENT WITH PREVIOUSLY REPORTED AND CALLED VALUE. Performed at [facility], [place] [address] [place], [place] [address]   Glucose, capillary     Status: None   Collection Time: 10/03/17 11:47 AM  Result Value Ref Range   Glucose-Capillary 94 65 - 99 mg/dL  Glucose, capillary     Status: None   Collection Time: 10/03/17  5:03 PM  Result Value Ref Range  Glucose-Capillary 83 65 - 99 mg/dL  Glucose, capillary     Status: None   Collection Time: 10/03/17 11:14 PM  Result Value Ref Range   Glucose-Capillary 85 65 - 99 mg/dL  CBC with Differential/Platelet     Status: Abnormal   Collection Time: 10/04/17  7:54 AM  Result Value Ref Range   WBC 8.6 4.0 - 10.5 K/uL   RBC 5.22 (H) 3.87 - 5.11 MIL/uL   Hemoglobin 14.1 12.0 - 15.0 g/dL   HCT 43.6 36.0 - 46.0 %   MCV 83.5 78.0 - 100.0 fL   MCH 27.0 26.0 - 34.0 pg   MCHC 32.3 30.0 - 36.0 g/dL   RDW 20.6 (H) 11.5 - 15.5 %   Platelets 134 (L) 150 - 400 K/uL   Neutrophils Relative % 62 %   Neutro Abs 5.3 1.7 - 7.7 K/uL   Lymphocytes Relative 25 %   Lymphs Abs 2.1 0.7 - 4.0 K/uL   Monocytes Relative 12 %   Monocytes Absolute 1.0 0.1 - 1.0 K/uL   Eosinophils Relative 1 %   Eosinophils Absolute 0.1 0.0 - 0.7 K/uL   Basophils Relative 0 %   Basophils Absolute 0.0 0.0 - 0.1 K/uL    Comment: Performed at [facility], [place] [address] [place], [place] [address]  Basic metabolic panel     Status: Abnormal   Collection Time: 10/04/17  7:54 AM  Result Value Ref Range   Sodium 141 135 - 145 mmol/L   Potassium 2.6 (LL) 3.5 - 5.1 mmol/L    Comment: CRITICAL RESULT CALLED TO, READ BACK BY AND VERIFIED WITH: GURLEY,K. RN AT 5809 10/04/17 MULLINS,T    Chloride 95 (L) 101 - 111 mmol/L   CO2 34 (H) 22 - 32 mmol/L   Glucose, Bld 85 65 - 99 mg/dL   BUN 6 6 - 20 mg/dL   Creatinine, Ser 0.60 0.44 - 1.00 mg/dL    Calcium 8.1 (L) 8.9 - 10.3 mg/dL   GFR calc non Af Amer >60 >60 mL/min   GFR calc Af Amer >60 >60 mL/min    Comment: (NOTE) The eGFR has been calculated using the CKD EPI equation. This calculation has not been validated in all clinical situations. eGFR's persistently <60 mL/min signify possible Chronic Kidney Disease.    Anion gap 12 5 - 15    Comment: Performed at [facility], [place] [address] [place], [place] [address]  Glucose, capillary     Status: None   Collection Time: 10/04/17  8:16 AM  Result Value Ref Range   Glucose-Capillary 96 65 - 99 mg/dL  Glucose, capillary     Status: None   Collection Time: 10/04/17 11:43 AM  Result Value Ref Range   Glucose-Capillary 93 65 - 99 mg/dL    Current Facility-Administered Medications  Medication Dose Route Frequency Provider Last Rate Last Dose  . 0.9 %  sodium chloride infusion  250 mL Intravenous PRN Gala Romney L, MD      . allopurinol (ZYLOPRIM) tablet 100 mg  100 mg Oral Daily Gala Romney L, MD   100 mg at 10/03/17 0805  . apixaban (ELIQUIS) tablet 5 mg  5 mg Oral BID Elwyn Reach, MD   5 mg at 10/03/17 2036  . atorvastatin (LIPITOR) tablet 80 mg  80 mg Oral q1800 Elwyn Reach, MD   80 mg at 10/03/17 1714  . cefTRIAXone (ROCEPHIN) 1 g in sodium chloride 0.9 % 100 mL IVPB  1 g Intravenous Q24H Elwyn Reach, MD   Stopped at 10/03/17 2106  . docusate sodium (COLACE) capsule 100 mg  100 mg Oral BID Gala Romney L, MD   100 mg at 10/03/17 2037  . dorzolamide (TRUSOPT) 2 % ophthalmic solution 1 drop  1 drop Both Eyes TID Gala Romney L, MD   1 drop at 10/03/17 2040  . furosemide (LASIX) injection 40 mg  40 mg Intravenous Daily Shelly Coss, MD   40 mg at 10/04/17 1234  . Gerhardt's butt cream   Topical QID Adhikari, Amrit, MD      . insulin aspart (novoLOG) injection 0-12 Units  0-12 Units Subcutaneous QID Garba, Mohammad L, MD      . LORazepam (ATIVAN) tablet 0.5 mg  0.5 mg Oral QHS  Adhikari, Amrit, MD      . MEDLINE mouth rinse  15 mL Mouth Rinse BID Gala Romney L, MD   15 mL at 10/03/17 2040  . Melatonin TABS 1 mg  1 mg Oral QHS Gala Romney L, MD   1 mg at 10/02/17 0009  . nitroGLYCERIN (NITROSTAT) SL tablet 0.4 mg  0.4 mg Sublingual Q5 min PRN Elwyn Reach, MD      . nutrition supplement (JUVEN) (JUVEN) powder packet 1 packet  1 packet Oral BID BM Shelly Coss, MD   1 packet at 10/03/17 1419  . OLANZapine (ZYPREXA) tablet 5 mg  5 mg Oral Daily Elwyn Reach, MD   5 mg at 10/03/17 0805  . ondansetron (ZOFRAN) tablet 4 mg  4 mg Oral Q6H PRN Elwyn Reach, MD       Or  . ondansetron (ZOFRAN) injection 4 mg  4 mg Intravenous Q6H PRN Gala Romney L, MD      . pantoprazole (PROTONIX) EC tablet 40 mg  40 mg Oral Daily Elwyn Reach, MD   40 mg at 10/03/17 0806  . potassium chloride 10 mEq in 100 mL IVPB  10 mEq Intravenous Q1 Hr x 6 Adhikari, Amrit, MD 100 mL/hr at 10/04/17 1238 10 mEq at 10/04/17 1238  . QUEtiapine (SEROQUEL) tablet 12.5 mg  12.5 mg Oral QHS Gala Romney L, MD   12.5 mg at 10/03/17 2037  . RESOURCE THICKENUP CLEAR 120 g  1 Can Oral PRN Gala Romney L, MD      . sodium chloride flush (NS) 0.9 % injection 3 mL  3 mL Intravenous Q12H Gala Romney L, MD   3 mL at 10/04/17 1242  . sodium chloride flush (NS) 0.9 % injection 3 mL  3 mL Intravenous PRN Elwyn Reach, MD        Musculoskeletal: Strength & Muscle Tone: decreased due to physical deconditioning.  Gait & Station: UTA since patient was lying in bed. Patient leans: N/A  Psychiatric Specialty Exam: Physical Exam  Nursing note and vitals reviewed. Constitutional: She appears well-developed and well-nourished.  HENT:  Head: Normocephalic and atraumatic.  Neck: Normal range of motion.  Respiratory: Effort normal.  Neurological: She is alert.  Only oriented to person.  Psychiatric: Thought content normal. Her affect is labile. Her speech is slurred. She is  agitated. Cognition and memory are impaired. She expresses impulsivity.    Review of Systems  Unable to perform ROS: Mental status change    Blood pressure 102/61, pulse 98, temperature 97.6 F (36.4 C), temperature source Oral, resp. rate 16, height 5' (1.524 m), weight 84.7 kg (186 lb 11.7 oz), SpO2 92 %.Body mass index  is 36.47 kg/m.  General Appearance: Fairly Groomed, elderly, Caucasian female, wearing a hospital gown and lying in bed. NAD.   Eye Contact:  Poor  Speech:  Slurred  Volume:  Decreased  Mood:  "Help me"  Affect:  Labile  Thought Process:  Disorganized and Descriptions of Associations: Loose  Orientation:  Other:  Person  Thought Content:  Hallucinations: Visual  Suicidal Thoughts:  No  Homicidal Thoughts:  No  Memory:  Immediate;   Poor Recent;   Poor Remote;   Poor  Judgement:  Impaired  Insight:  Lacking  Psychomotor Activity:  Decreased  Concentration:  Concentration: Poor and Attention Span: Poor  Recall:  Poor  Fund of Knowledge:  Poor  Language:  Fair  Akathisia:  No  Handed:  Right  AIMS (if indicated):   N/A  Assets:  Housing Social Support  ADL's:  Impaired  Cognition: Impaired due to AMS. She has a history of dementia.   Sleep:   N/A   Assessment:  Stacy Dorsey is 72 y.o. female who was admitted with acute exacerbation of CHF and AMS secondary to UTI. She has been confused and yelling out to staff in distress and resisting care due to paranoia about being harmed. She may benefit from increasing Zyprexa for psychosis. She does not warrant inpatient psychiatric hospitalization at this time since AMS is secondary to delirium due to medical condition. According to nursing this is an acute mental status change from her baseline since her recent hospitalization when she was oriented to person, place and time.   Treatment Plan Summary: -Increase Zyprexa 5 mg daily to 2.5 mg daily and 5 mg qhs for psychosis. Can increase up to 5 mg BID if only partially  effective.  -Discontinue Seroquel to avoid multiple antipsychotic use and polypharmacy.  -EKG reviewed and QTc 489 on 3/19. Recommend repeating EKG since increasing dose of possible QTc prolonging agent.  -Continue Melatonin 1 mg qhs to regulate sleep/wake cycle.  -Psychiatry will sign off on patient at this time. Please consult psychiatry again as needed.   Disposition: Patient does not meet criteria for psychiatric inpatient admission.  Faythe Dingwall, DO 10/04/2017 1:31 PM

## 2017-10-04 NOTE — Progress Notes (Addendum)
PROGRESS NOTE    Stacy Dorsey  ZOX:[id number] DOB: Nov 30, 1945 DOA: 10/01/2017 PCP: Patient, No Pcp Per   Brief Narrative: Patient is a 72 year old female with past medical history of systolic CHF, recurrent UTI who presented from home with shortness of breath, swelling of lower extremity and confusion.  Patient was admitted for management of acute exacerbation of CHF and confusion secondary to urinary tract infection  Assessment & Plan:   Principal Problem:   CHF exacerbation (HCC) Active Problems:   Acute lower UTI   CAD (coronary artery disease)   Essential hypertension   Paroxysmal A-fib (HCC): Hx of per hospitalization of 08/02/2017--[facility]   CHF (congestive heart failure), NYHA class III, acute on chronic, combined (HCC)   Confusion   Acute exacerbation of systolic CHF: Patient's ejection fraction was reported to be around 25% as per the echocardiogram done last year as per family. Chest x-ray showed small pleural effusions bilaterally with cardiomegaly and pulmonary edema and possible bibasilar pneumonia.  Started on diuretics.  She is on Lasix 40 mg daily Echocardiogram done here shows left ventricular ejection fraction of 20%. She was not on Lasix at home because of hypotension. She follows with Dr. Bary Castilla as an outpatient.  UTI: Has history of recurrent UTIs in the past.  UA suggestive of UTI here.  Urine culture does not show growth. On Rocephin.  Delirium/confusion: We have requested for psychiatry consultation today for evaluation of agitation/delirium. Has history of confusion with UTI in the past. She also has H/O dementia. Patient still found to be confused and agitated this morning.  Continue her home medications. On Zyprexa and quetiapine. We will try to minimize environmental change.  We will keep the curtains open in the room for the light.   She should follow-up with dementia as an outpatient for the management of her dementia. If her mental status does  not improve in few days, will discuss with family again and  consider palliative care evaluation for possible hospice.  Paroxysmal A. fib: On Eliquis.  Rate is controlled.  She is on Cardizem which will be discontinued because of systolic dysfunction..She follows with cardiology Dr. Bary Castilla at [facility].We can consider starting  beta blocker but her blood pressure is soft.  Coronary artery disease: Mild elevation of troponin most likely secondary to CHF.  Denies any chest pain.  No evidence of acute coronary syndrome.    DVT prophylaxis:Eliquis Code Status: Full Family Communication: Discussed with her daughter on phone Disposition Plan: SNF/Home after resolution of CHF and confusion   Consultants: None  Procedures: None  Antimicrobials: Ceftriaxone since 10/01/17  Subjective: Patient seen and examined the bedside this morning.  She was given ativan at night so she was pretty sedated.  Objective: Vitals:   10/03/17 2036 10/03/17 2303 10/04/17 0651 10/04/17 1238  BP: (!) 99/42 (!) 95/59 (!) 99/49 102/61  Pulse:  (!) 119 (!) 101 98  Resp:  16 18 16   Temp:  98.9 F (37.2 C) 97.8 F (36.6 C) 97.6 F (36.4 C)  TempSrc:  Axillary Oral Oral  SpO2:  93% 97% 92%  Weight:      Height:        Intake/Output Summary (Last 24 hours) at 10/04/2017 1258 Last data filed at 10/03/2017 1731 Gross per 24 hour  Intake 360 ml  Output 1200 ml  Net -840 ml   Filed Weights   10/01/17 1710 10/01/17 1718 10/01/17 2256  Weight: 90.7 kg (200 lb) 90.7 kg (200 lb) 84.7  kg (186 lb 11.7 oz)    Examination:  General exam: Sedated,somnolent HEENT:PERRL,Oral mucosa moist, Ear/Nose normal on gross exam Respiratory system: Bilateral decreased air entry  cardiovascular system: S1 & S2 heard, RRR. No JVD, murmurs, rubs, gallops or clicks. Gastrointestinal system: Abdomen is nondistended, soft and nontender. No organomegaly or masses felt. Normal bowel sounds heard. Central nervous system: Not alert or   oriented. No focal neurological deficits. Extremities: Trace edema, no clubbing ,no cyanosis, distal peripheral pulses palpable. Skin: No rashes, lesions or ulcers,no icterus ,no pallor MSK: Normal muscle bulk,tone ,power     Data Reviewed: I have personally reviewed following labs and imaging studies  CBC: Recent Labs  Lab 10/01/17 1739 10/04/17 0754  WBC 11.1* 8.6  NEUTROABS 8.7* 5.3  HGB 12.7 14.1  HCT 41.8 43.6  MCV 86.9 83.5  PLT 202 134*   Basic Metabolic Panel: Recent Labs  Lab 10/01/17 1739 10/04/17 0754  NA 142 141  K 4.8 2.6*  CL 104 95*  CO2 26 34*  GLUCOSE 136* 85  BUN 11 6  CREATININE 0.74 0.60  CALCIUM 8.8* 8.1*   GFR: Estimated Creatinine Clearance: 63.2 mL/min (by C-G formula based on SCr of 0.6 mg/dL). Liver Function Tests: Recent Labs  Lab 10/01/17 1909  AST 46*  ALT 14  ALKPHOS 144*  BILITOT 1.2  PROT 6.3*  ALBUMIN 2.5*   No results for input(s): LIPASE, AMYLASE in the last 168 hours. No results for input(s): AMMONIA in the last 168 hours. Coagulation Profile: No results for input(s): INR, PROTIME in the last 168 hours. Cardiac Enzymes: Recent Labs  Lab 10/01/17 1739 10/02/17 0009 10/02/17 1443  TROPONINI 0.04* 0.04* 0.07*  0.07*   BNP (last 3 results) No results for input(s): PROBNP in the last 8760 hours. HbA1C: No results for input(s): HGBA1C in the last 72 hours. CBG: Recent Labs  Lab 10/03/17 1147 10/03/17 1703 10/03/17 2314 10/04/17 0816 10/04/17 1143  GLUCAP 94 83 85 96 93   Lipid Profile: No results for input(s): CHOL, HDL, LDLCALC, TRIG, CHOLHDL, LDLDIRECT in the last 72 hours. Thyroid Function Tests: Recent Labs    10/02/17 1443  TSH 4.397   Anemia Panel: No results for input(s): VITAMINB12, FOLATE, FERRITIN, TIBC, IRON, RETICCTPCT in the last 72 hours. Sepsis Labs: No results for input(s): PROCALCITON, LATICACIDVEN in the last 168 hours.  Recent Results (from the past 240 hour(s))  Urine culture      Status: None   Collection Time: 10/01/17  5:23 PM  Result Value Ref Range Status   Specimen Description   Final    URINE, RANDOM Performed at [facility], [address] [place], [place] [address]    Special Requests   Final    NONE Performed at [facility], [address], [place], [place] [address]    Culture   Final    NO GROWTH Performed at [facility], [address] [place], [place] [address]    Report Status 10/03/2017 FINAL  Final         Radiology Studies: No results found.      Scheduled Meds: . allopurinol  100 mg Oral Daily  . apixaban  5 mg Oral BID  . atorvastatin  80 mg Oral q1800  . diltiazem  30 mg Oral Q8H  . docusate sodium  100 mg Oral BID  . dorzolamide  1 drop Both Eyes TID  . furosemide  40 mg Intravenous Daily  . Gerhardt's butt  cream   Topical QID  . insulin aspart  0-12 Units Subcutaneous QID  . LORazepam  0.5 mg Oral QHS  . mouth rinse  15 mL Mouth Rinse BID  . Melatonin  1 mg Oral QHS  . nutrition supplement (JUVEN)  1 packet Oral BID BM  . OLANZapine  5 mg Oral Daily  . pantoprazole  40 mg Oral Daily  . QUEtiapine  12.5 mg Oral QHS  . sodium chloride flush  3 mL Intravenous Q12H   Continuous Infusions: . sodium chloride    . cefTRIAXone (ROCEPHIN)  IV Stopped (10/03/17 2106)  . potassium chloride 10 mEq (10/04/17 1238)     LOS: 3 days        Burnadette PopAmrit Demir Titsworth, MD [facility] Pager [phone]  If 7PM-7AM, please contact night-coverage [email or web address] Password [email or web address] 10/04/2017, 12:58 PM

## 2017-10-04 NOTE — Care Management Important Message (Signed)
Important Message  Patient Details IM Letter given to Cookie/Case Manager to present to the Patient Name: Stacy ShorterJanice Dorsey MRN: [id number] Date of Birth: [birth year]   Medicare Important Message Given:  Yes    Caren MacadamFuller, Dessiree Sze 10/04/2017, 11:37 AM

## 2017-10-04 NOTE — Progress Notes (Signed)
PT Cancellation Note  Patient Details Name: Stacy ShorterJanice Dorsey MRN: [id number] DOB: 09/29/1945   Cancelled Treatment:    Reason Eval/Treat Not Completed: Fatigue/lethargy limiting ability to participate. Spoke with RN who recommended PT be held at this time. Will check back another day. Thanks.    Rebeca AlertJannie Janyth Riera, MPT Pager: [phone]

## 2017-10-04 NOTE — Progress Notes (Addendum)
CRITICAL VALUE ALERT  Critical Value:  Potassium 2.6  Date & Time Notied:  10/04/17 0930  Provider Notified: Dr Renford DillsAdhikari via page

## 2017-10-05 DIAGNOSIS — L89151 Pressure ulcer of sacral region, stage 1: Secondary | ICD-10-CM

## 2017-10-05 LAB — BASIC METABOLIC PANEL
ANION GAP: 12 (ref 5–15)
BUN: 7 mg/dL (ref 6–20)
CALCIUM: 8 mg/dL — AB (ref 8.9–10.3)
CO2: 32 mmol/L (ref 22–32)
Chloride: 97 mmol/L — ABNORMAL LOW (ref 101–111)
Creatinine, Ser: 0.65 mg/dL (ref 0.44–1.00)
Glucose, Bld: 103 mg/dL — ABNORMAL HIGH (ref 65–99)
Potassium: 4.1 mmol/L (ref 3.5–5.1)
Sodium: 141 mmol/L (ref 135–145)

## 2017-10-05 LAB — GLUCOSE, CAPILLARY
GLUCOSE-CAPILLARY: 95 mg/dL (ref 65–99)
GLUCOSE-CAPILLARY: 133 mg/dL — AB (ref 65–99)
Glucose-Capillary: 99 mg/dL (ref 65–99)
Glucose-Capillary: 126 mg/dL — ABNORMAL HIGH (ref 65–99)

## 2017-10-05 LAB — TROPONIN I: Troponin I: 0.07 ng/mL (ref <0.03)

## 2017-10-05 LAB — POTASSIUM: Potassium: 3.1 mmol/L — ABNORMAL LOW (ref 3.5–5.1)

## 2017-10-05 MED ORDER — INSULIN ASPART 100 UNIT/ML ~~LOC~~ SOLN
0.0000 [IU] | Freq: Three times a day (TID) | SUBCUTANEOUS | Status: DC
  Administered 2017-10-06: 2 [IU] via SUBCUTANEOUS
  Administered 2017-10-07: 4 [IU] via SUBCUTANEOUS

## 2017-10-05 MED ORDER — FUROSEMIDE 40 MG PO TABS
40.0000 mg | ORAL_TABLET | Freq: Every day | ORAL | Status: DC
  Administered 2017-10-05 – 2017-10-07 (×3): 40 mg via ORAL
  Filled 2017-10-05 (×4): qty 1
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
## 2017-10-05 NOTE — Plan of Care (Signed)
Patient continues to be confused and yelling entire shift.  Patient yells out "help me" regardless if anyone in room or not including family.  Daughter and other family members at bedside, daughter requesting to speak with palliative care as she is the primary decision maker for her mother and is not sure about her mother's prognosis or what is the best placement for patient after leaving hospital. Daughter had been taking care of mother at home with help from a granddaughter prior to this admission.  MD ordered palliative care consult.    Patient did eat around 25% of one meal for daughter, drank some juice and water.  Able to get patient to take her medications that could be crushed, spit out any pills that could not be crushed.  No evidence of any swallowing difficulties noted by this RN.

## 2017-10-05 NOTE — Progress Notes (Signed)
Late entry:  Pt was complaining of chest pain and stated her heart was "pounding". NP Bodenheimer was paged. Vitals and EKG obtained. New orders given. Will continue to monitor patient.

## 2017-10-05 NOTE — Progress Notes (Signed)
PROGRESS NOTE    Stacy Dorsey  BJY:[id number] DOB: 1946/03/23 DOA: 10/01/2017 PCP: Patient, No Pcp Per   Brief Narrative: Patient is a 72 year old female with past medical history of systolic CHF, recurrent UTI who presented from home with shortness of breath, swelling of lower extremity and confusion.  Patient was admitted for management of acute exacerbation of CHF and confusion secondary to urinary tract infection.  Assessment & Plan:   Principal Problem:   Delirium due to another medical condition Active Problems:   Acute lower UTI   CAD (coronary artery disease)   CHF exacerbation (HCC)   Essential hypertension   Paroxysmal A-fib (HCC): Hx of per hospitalization of 08/02/2017--[facility]   CHF (congestive heart failure), NYHA class III, acute on chronic, combined (HCC)   Acute exacerbation of systolic CHF: Patient's ejection fraction was reported to be around 25% as per the echocardiogram done last year as per family. Chest x-ray showed small pleural effusions bilaterally with cardiomegaly and pulmonary edema and possible bibasilar pneumonia.  Started on diuretics.  She is on Lasix 40 mg daily .Echocardiogram done here shows left ventricular ejection fraction of 20%. She was not on Lasix at home because of hypotension. She follows with Dr. Bary Castilla as an outpatient.  UTI: Has history of recurrent UTIs in the past.  UA suggestive of UTI here.  Urine culture does not show growth. On Rocephin.She will complete Abx course tomorrow.  Delirium/confusion: Psychiatry  evaluation done for agitation/delirium. Has history of confusion with UTI in the past. She also has H/O dementia.  Recommended to discontinue quetiapine and continue on the Zyprexa for now . Patient still found to be confused this morning. We will try to minimize environmental change.  We will keep the curtains open in the room for the light. I have requested if she can be moved to a brighter room sunlight. She should  follow-up with dementia clinic as an outpatient for the management of her dementia. If her mental status does not improve in few days, will discuss with family again and  consider palliative care evaluation for possible hospice.  Paroxysmal A. fib: On Eliquis.  Rate is controlled.  She is on Cardizem which has been discontinued because of systolic dysfunction.She follows with cardiology Dr. Bary Castilla at [facility].We can consider starting  beta blocker but her blood pressure is soft.  Coronary artery disease: Mild elevation of troponin most likely secondary to CHF.  Denies any chest pain today.  No evidence of acute coronary syndrome.EKG hasnot shown any new changes.Shows prolonged QTc     DVT prophylaxis:Eliquis Code Status: Full Family Communication: None present at the bedside today Disposition Plan: SNF/Home after resolution of CHF and confusion   Consultants: None  Procedures: None  Antimicrobials: Ceftriaxone since 10/01/17  Subjective: Patient seen and examined the bedside this morning.  Remains confused.  Mental status slightly improved from yesterday.  Objective: Vitals:   10/04/17 1238 10/04/17 2211 10/05/17 0430 10/05/17 1014  BP: 102/61 (!) 95/55 (!) 96/58 107/68  Pulse: 98 90 (!) 103 (!) 105  Resp: 16  16   Temp: 97.6 F (36.4 C) (!) 101.3 F (38.5 C) 99 F (37.2 C)   TempSrc: Oral Axillary Tympanic   SpO2: 92% 94% 96% 96%  Weight:      Height:        Intake/Output Summary (Last 24 hours) at 10/05/2017 1218 Last data filed at 10/05/2017 0200 Gross per 24 hour  Intake 700 ml  Output -  Net  700 ml   Filed Weights   10/01/17 1710 10/01/17 1718 10/01/17 2256  Weight: 90.7 kg (200 lb) 90.7 kg (200 lb) 84.7 kg (186 lb 11.7 oz)    Examination:  General exam: Not in distress,confused HEENT:PERRL,Oral mucosa moist, Ear/Nose normal on gross exam Respiratory system: Bilateral decreased air entry on the bases cardiovascular system: S1 & S2 heard, RRR. No JVD,  murmurs, rubs, gallops or clicks. Gastrointestinal system: Abdomen is nondistended, soft and nontender. No organomegaly or masses felt. Normal bowel sounds heard. Central nervous system: Not alert or  oriented. No focal neurological deficits. Extremities: No edema, no clubbing ,no cyanosis, distal peripheral pulses palpable. Skin: No rashes, lesions or ulcers,no icterus ,no pallor MSK: Normal muscle bulk,tone ,power     Data Reviewed: I have personally reviewed following labs and imaging studies  CBC: Recent Labs  Lab 10/01/17 1739 10/04/17 0754  WBC 11.1* 8.6  NEUTROABS 8.7* 5.3  HGB 12.7 14.1  HCT 41.8 43.6  MCV 86.9 83.5  PLT 202 134*   Basic Metabolic Panel: Recent Labs  Lab 10/01/17 1739 10/04/17 0754 10/04/17 2350 10/05/17 0405  NA 142 141  --  141  K 4.8 2.6* 3.1* 4.1  CL 104 95*  --  97*  CO2 26 34*  --  32  GLUCOSE 136* 85  --  103*  BUN 11 6  --  7  CREATININE 0.74 0.60  --  0.65  CALCIUM 8.8* 8.1*  --  8.0*   GFR: Estimated Creatinine Clearance: 63.2 mL/min (by C-G formula based on SCr of 0.65 mg/dL). Liver Function Tests: Recent Labs  Lab 10/01/17 1909  AST 46*  ALT 14  ALKPHOS 144*  BILITOT 1.2  PROT 6.3*  ALBUMIN 2.5*   No results for input(s): LIPASE, AMYLASE in the last 168 hours. No results for input(s): AMMONIA in the last 168 hours. Coagulation Profile: No results for input(s): INR, PROTIME in the last 168 hours. Cardiac Enzymes: Recent Labs  Lab 10/01/17 1739 10/02/17 0009 10/02/17 1443 10/04/17 2350  TROPONINI 0.04* 0.04* 0.07*  0.07* 0.07*   BNP (last 3 results) No results for input(s): PROBNP in the last 8760 hours. HbA1C: No results for input(s): HGBA1C in the last 72 hours. CBG: Recent Labs  Lab 10/04/17 1143 10/04/17 1745 10/04/17 2226 10/05/17 0741 10/05/17 1141  GLUCAP 93 89 97 95 99   Lipid Profile: No results for input(s): CHOL, HDL, LDLCALC, TRIG, CHOLHDL, LDLDIRECT in the last 72 hours. Thyroid Function  Tests: Recent Labs    10/02/17 1443  TSH 4.397   Anemia Panel: No results for input(s): VITAMINB12, FOLATE, FERRITIN, TIBC, IRON, RETICCTPCT in the last 72 hours. Sepsis Labs: No results for input(s): PROCALCITON, LATICACIDVEN in the last 168 hours.  Recent Results (from the past 240 hour(s))  Urine culture     Status: None   Collection Time: 10/01/17  5:23 PM  Result Value Ref Range Status   Specimen Description   Final    URINE, RANDOM Performed at [facility], [address] [place], [place] [address]    Special Requests   Final    NONE Performed at [facility], [address] [place], [place] [address]    Culture   Final    NO GROWTH Performed at [facility], [address] [place], [place] [address]    Report Status 10/03/2017 FINAL  Final         Radiology Studies: No results found.  Scheduled Meds: . allopurinol  100 mg Oral Daily  . apixaban  5 mg Oral BID  . atorvastatin  80 mg Oral q1800  . docusate sodium  100 mg Oral BID  . dorzolamide  1 drop Both Eyes TID  . furosemide  40 mg Oral Daily  . Gerhardt's butt cream   Topical QID  . insulin aspart  0-12 Units Subcutaneous QID  . mouth rinse  15 mL Mouth Rinse BID  . Melatonin  1 mg Oral QHS  . nutrition supplement (JUVEN)  1 packet Oral BID BM  . OLANZapine  2.5 mg Oral Daily  . OLANZapine  5 mg Oral QHS  . pantoprazole  40 mg Oral Daily  . sodium chloride flush  3 mL Intravenous Q12H   Continuous Infusions: . sodium chloride    . cefTRIAXone (ROCEPHIN)  IV Stopped (10/05/17 0159)     LOS: 4 days        Burnadette Pop, MD [facility] Pager [phone]  If 7PM-7AM, please contact night-coverage [email or web address] Password [facility] 10/05/2017, 12:18 PM

## 2017-10-06 LAB — CBC WITH DIFFERENTIAL/PLATELET
Basophils Absolute: 0 10*3/uL (ref 0.0–0.1)
Basophils Relative: 0 %
Eosinophils Absolute: 0.1 10*3/uL (ref 0.0–0.7)
Eosinophils Relative: 2 %
HEMATOCRIT: 37.7 % (ref 36.0–46.0)
HEMOGLOBIN: 11.9 g/dL — AB (ref 12.0–15.0)
LYMPHS ABS: 2.2 10*3/uL (ref 0.7–4.0)
Lymphocytes Relative: 27 %
MCH: 26.6 pg (ref 26.0–34.0)
MCHC: 31.6 g/dL (ref 30.0–36.0)
MCV: 84.3 fL (ref 78.0–100.0)
MONOS PCT: 10 %
Monocytes Absolute: 0.9 10*3/uL (ref 0.1–1.0)
NEUTROS ABS: 5.1 10*3/uL (ref 1.7–7.7)
NEUTROS PCT: 61 %
Platelets: 134 10*3/uL — ABNORMAL LOW (ref 150–400)
RBC: 4.47 MIL/uL (ref 3.87–5.11)
RDW: 20.1 % — ABNORMAL HIGH (ref 11.5–15.5)
WBC: 8.4 10*3/uL (ref 4.0–10.5)

## 2017-10-06 LAB — BASIC METABOLIC PANEL
Anion gap: 13 (ref 5–15)
BUN: 7 mg/dL (ref 6–20)
CHLORIDE: 95 mmol/L — AB (ref 101–111)
CO2: 32 mmol/L (ref 22–32)
CREATININE: 0.63 mg/dL (ref 0.44–1.00)
Calcium: 7.7 mg/dL — ABNORMAL LOW (ref 8.9–10.3)
GFR calc Af Amer: >60 mL/min (ref >60)
GFR calc non Af Amer: >60 mL/min (ref >60)
Glucose, Bld: 103 mg/dL — ABNORMAL HIGH (ref 65–99)
POTASSIUM: 2.5 mmol/L — AB (ref 3.5–5.1)
Sodium: 140 mmol/L (ref 135–145)

## 2017-10-06 LAB — GLUCOSE, CAPILLARY
GLUCOSE-CAPILLARY: 147 mg/dL — AB (ref 65–99)
GLUCOSE-CAPILLARY: 133 mg/dL — AB (ref 65–99)
Glucose-Capillary: 154 mg/dL — ABNORMAL HIGH (ref 65–99)
Glucose-Capillary: 170 mg/dL — ABNORMAL HIGH (ref 65–99)

## 2017-10-06 LAB — POTASSIUM: Potassium: 3.6 mmol/L (ref 3.5–5.1)

## 2017-10-06 MED ORDER — POTASSIUM CHLORIDE CRYS ER 20 MEQ PO TBCR
40.0000 meq | EXTENDED_RELEASE_TABLET | Freq: Once | ORAL | Status: AC
  Administered 2017-10-06: 40 meq via ORAL
  Filled 2017-10-06: qty 2

## 2017-10-06 MED ORDER — SODIUM CHLORIDE 0.9 % IV BOLUS (SEPSIS)
500.0000 mL | Freq: Once | INTRAVENOUS | Status: AC
  Administered 2017-10-06: 500 mL via INTRAVENOUS

## 2017-10-06 MED ORDER — POTASSIUM CHLORIDE 10 MEQ/100ML IV SOLN
10.0000 meq | INTRAVENOUS | Status: AC
  Administered 2017-10-06 (×5): 10 meq via INTRAVENOUS
  Filled 2017-10-06 (×5): qty 100

## 2017-10-06 MED ORDER — LORAZEPAM 2 MG/ML IJ SOLN
1.0000 mg | Freq: Once | INTRAMUSCULAR | Status: AC
  Administered 2017-10-07: 1 mg via INTRAVENOUS
  Filled 2017-10-06: qty 1

## 2017-10-06 NOTE — Progress Notes (Signed)
PROGRESS NOTE    Stacy Dorsey  ERX:[id number] DOB: 06/01/1946 DOA: 10/01/2017 PCP: Patient, No Pcp Per   Brief Narrative:   72 year old with past medical history relevant for systolic heart failure EF of 20% by echo on 10/03/2017, hypertension, paroxysmal atrial fibrillation on apixaban, gout, hyperlipidemia, type 2 diabetes, coronary artery disease status post multiple stents, prior CVA, dementia admitted with acute systolic congestive heart failure exacerbation.   Assessment & Plan:   Principal Problem:   Delirium due to another medical condition Active Problems:   Acute lower UTI   CAD (coronary artery disease)   CHF exacerbation (HCC)   Essential hypertension   Paroxysmal A-fib ([place]): Hx of per hospitalization of 08/02/2017--[facility]   CHF (congestive heart failure), NYHA class III, acute on chronic, combined ([place])   Pressure injury of coccygeal region, stage 1   #) Acute systolic congestive heart failure exacerbation: Patient symptoms have resolved with significant amounts of IV diuresis.  She had been recently hospitalized for UTI and altered mental status as well as fluid overload and apparently her home furosemide was discontinued due to hypotension. -Continue oral furosemide 40 mill grams daily -2 g sodium restricted diet, weigh daily, strict ins and outs, fluid restriction -Telemetry  #) dementia/agitation: Patient has been persistently delirious here likely secondary to her surroundings, underlying dementia, her chronic medical conditions.  Unfortunately her mental status continues to show hypoactive delirium. -Psychiatry consulted, appreciate recommendations next line-continue olanzapine 2.5 mg every morning and 5 mg nightly -Continue melatonin 1 mg nightly next line- per discussion with the daughter she would like palliative care evaluation which would be eminently reasonable considering her extremely severe congestive heart failure -Palliative care  consult  #) Recurrent UTIs: UA here is negative -Discontinue ceftriaxone 1 g daily  #) Paroxysmal atrial fibrillation: Patient is on diltiazem at home every 8 hours for her atrial fibrillation however it is unclear with her severe congestive heart failure if this is the best medication at this time.  Unfortunately it appears she has an allergy to carvedilol to cause anaphylaxis. -Telemetry -Continue apixaban 5 mg twice daily -Continue to hold diltiazem 30 mg every 8 hours  #) Gout: -Continue allopurinol 100 mg daily  #) Hyperlipidemia: -Continue atorvastatin 80 mg daily  #) Coronary artery disease status post multiple stents: - Continue atorvastatin -Apixaban for anticoagulation -Patient does not tolerate beta-blocker -Patient's blood pressure does not tolerate ACE inhibitor  #)  type 2 diabetes: -Continue sliding scale insulin here  Fluids: Restrict Elect lites: Monitor and support Nutrition: Sodium restricted fluid restricted diet  Prophylaxis: Apixaban  Disposition: Pending discussion with palliative care  DO NOT RESUSCITATE  Consultants:   Psychiatry  Palliative care  Procedures: (Don't include imaging studies which can be auto populated. Include things that cannot be auto populated i.e. Echo, Carotid and venous dopplers, Foley, Bipap, HD, tubes/drains, wound vac, central lines etc) Echo 10/03/2017: - Left ventricle: LVEF is severely depressed at approximately 20%   The cavity size was mildly dilated. Wall thickness was increased   in a pattern of mild LVH. - Mitral valve: There was mild regurgitation. - Right ventricle: The cavity size was mildly dilated. - Right atrium: The atrium was mildly dilated.  - Pulmonary arteries: PA peak pressure: 45 mm Hg (S).  Antimicrobials: (specify start and planned stop date. Auto populated tables are space occupying and do not give end dates)  IV ceftriaxone 3/19 through 10/06/2017   Subjective: Patient cannot provide  any history.  She can only  tell me that she is in the hospital.  She met intermittently screams for help.  She otherwise is unable to participate in exam or questioning.  Objective: Vitals:   10/05/17 1014 10/05/17 1445 10/05/17 2022 10/06/17 0335  BP: 107/68 114/63 (!) 98/59 (!) 90/58  Pulse: (!) 105 (!) 109 (!) 117 (!) 104  Resp:  _0 Temp:  98.1 F (36.7 C) 99.3 F (37.4 C) 99 F (37.2 C)  TempSrc:  Oral Axillary Axillary  SpO2: 96% 94% 96% 93%  Weight:      Height:        Intake/Output Summary (Last 24 hours) at 10/06/2017 1151 Last data filed at 10/06/2017 0600 Gross per 24 hour  Intake 1192.17 ml  Output 475 ml  Net 717.17 ml   Filed Weights   10/01/17 1710 10/01/17 1718 10/01/17 2256  Weight: 90.7 kg (200 lb) 90.7 kg (200 lb) 84.7 kg (186 lb 11.7 oz)    Examination:  General exam: Agitated, in mild distress Respiratory system: Clear to auscultation. Respiratory effort normal.  No increased work of breathing, no crackles, wheezes, rales Cardiovascular system: Distant heart sounds, regular rate and rhythm, no murmurs Gastrointestinal system: Soft, nondistended, nontender, plus bowel sounds Central nervous system: Patient is not alert or oriented.  She has no focal neurological deficits on exam.  She cannot participate with a complete CNS exam Extremities: No lower extremity edema Skin: No rashes on visible skin Psychiatry: Unable to participate due to patient condition    Data Reviewed: I have personally reviewed following labs and imaging studies  CBC: Recent Labs  Lab 10/01/17 1739 10/04/17 0754 10/06/17 0515  WBC 11.1* 8.6 8.4  NEUTROABS 8.7* 5.3 5.1  HGB 12.7 14.1 11.9*  HCT 41.8 43.6 37.7  MCV 86.9 83.5 84.3  PLT 202 134* 782*   Basic Metabolic Panel: Recent Labs  Lab 10/01/17 1739 10/04/17 0754 10/04/17 2350 10/05/17 0405 10/06/17 0515  NA 142 141  --  141 140  K 4.8 2.6* 3.1* 4.1 2.5*  CL 104 95*  --  97* 95*  CO2 26 34*  --  32 32   GLUCOSE 136* 85  --  103* 103*  BUN 11 6  --  7 7  CREATININE 0.74 0.60  --  0.65 0.63  CALCIUM 8.8* 8.1*  --  8.0* 7.7*   GFR: Estimated Creatinine Clearance: 63.2 mL/min (by C-G formula based on SCr of 0.63 mg/dL). Liver Function Tests: Recent Labs  Lab 10/01/17 1909  AST 46*  ALT 14  ALKPHOS 144*  BILITOT 1.2  PROT 6.3*  ALBUMIN 2.5*   No results for input(s): LIPASE, AMYLASE in the last 168 hours. No results for input(s): AMMONIA in the last 168 hours. Coagulation Profile: No results for input(s): INR, PROTIME in the last 168 hours. Cardiac Enzymes: Recent Labs  Lab 10/01/17 1739 10/02/17 0009 10/02/17 1443 10/04/17 2350  TROPONINI 0.04* 0.04* 0.07*  0.07* 0.07*   BNP (last 3 results) No results for input(s): PROBNP in the last 8760 hours. HbA1C: No results for input(s): HGBA1C in the last 72 hours. CBG: Recent Labs  Lab 10/05/17 0741 10/05/17 1141 10/05/17 1725 10/05/17 2119 10/06/17 1131  GLUCAP 95 99 126* 133* 170*   Lipid Profile: No results for input(s): CHOL, HDL, LDLCALC, TRIG, CHOLHDL, LDLDIRECT in the last 72 hours. Thyroid Function Tests: No results for input(s): TSH, T4TOTAL, FREET4, T3FREE, THYROIDAB in the last 72 hours. Anemia Panel: No results for input(s): VITAMINB12, FOLATE, FERRITIN, TIBC,  IRON, RETICCTPCT in the last 72 hours. Sepsis Labs: No results for input(s): PROCALCITON, LATICACIDVEN in the last 168 hours.  Recent Results (from the past 240 hour(s))  Urine culture     Status: None   Collection Time: 10/01/17  5:23 PM  Result Value Ref Range Status   Specimen Description   Final    URINE, RANDOM Performed at [facility] [address], [place], [place] [address]    Special Requests   Final    NONE Performed at [facility], [place] [address] [place], [place] [address]    Culture   Final    NO GROWTH Performed at [facility], [place] [address], [place],  [address]     Report Status 10/03/2017 FINAL  Final         Radiology Studies: No results found.      Scheduled Meds: . allopurinol  100 mg Oral Daily  . apixaban  5 mg Oral BID  . atorvastatin  80 mg Oral q1800  . docusate sodium  100 mg Oral BID  . dorzolamide  1 drop Both Eyes TID  . furosemide  40 mg Oral Daily  . Gerhardt's butt cream   Topical QID  . insulin aspart  0-12 Units Subcutaneous TID WC  . mouth rinse  15 mL Mouth Rinse BID  . Melatonin  1 mg Oral QHS  . nutrition supplement (JUVEN)  1 packet Oral BID BM  . OLANZapine  2.5 mg Oral Daily  . OLANZapine  5 mg Oral QHS  . pantoprazole  40 mg Oral Daily  . sodium chloride flush  3 mL Intravenous Q12H   Continuous Infusions: . sodium chloride    . cefTRIAXone (ROCEPHIN)  IV Stopped (10/06/17 0120)  . potassium chloride 10 mEq (10/06/17 1101)     LOS: 5 days    Time spent: [place], MD [facility]  If 7PM-7AM, please contact night-coverage [email or web address] Password [facility] 10/06/2017, 11:51 AM

## 2017-10-06 NOTE — Progress Notes (Signed)
CRITICAL VALUE STICKER  CRITICAL VALUE: Potassium 2.5  DATE & TIME NOTIFIED: 10/06/17 [id number]  MD NOTIFIED: NP [place]   TIME OF NOTIFICATION: [id number]  RESPONSE: no new orders at this time

## 2017-10-06 NOTE — Progress Notes (Deleted)
Pt sustained a 10 beat run of vtach. NP on call notified. No new orders at this time. Will continue to monitor.

## 2017-10-06 NOTE — Plan of Care (Signed)
Patient stable during 7 a to 7 p shift, continues with poor appetite although will eat ice cream and drink water and tea intermittently.  Multiple family members at bedside, patient continuously yells out even with family members in room.

## 2017-10-06 NOTE — Consult Note (Signed)
Consultation Note Date: 10/06/2017   Patient Name: Stacy ShorterJanice Dorsey  DOB: 11/17/1945  MRN: [id number]  Age / Sex: 72 y.o., female  PCP: Patient, No Pcp Per Referring Physician: Delaine LamePurohit, Shrey C, MD  Reason for Consultation: Establishing goals of care  HPI/Patient Profile: 72 y.o. female    admitted on 10/01/2017    Clinical Assessment and Goals of Care:  72 year old lady with a past medical history significant for heart failure with reduced ejection fraction - noted to be 20% by echocardiogram obtained during this hospitalization on 3-20 1-19, hypertension, paroxysmal atrial fibrillation, maintained on oral anticoagulation with eliquis, gout, dyslipidemia, diabetes, coronary artery disease status post multiple stent placements, prior history of stroke, prior history of dementia/neurocognitive decline. Agent had a fall in November 2018, had a femur fracture at that time. Subsequently she went to skilled nursing facility but has required a few hospitalizations since then for recurrent urinary tract infections, resultant encephalopathy.  Patient has been admitted to the hospital medicine service since 10-01-17 for acute systolic congestive heart failure exacerbation, acute encephalopathy. Patient has been given IV Rocephin. Patient has been seen and evaluated by psychiatry and has been started on Zyprexa and melatonin, Seroquel has been discontinued.  The patient remains with generalized deconditioning, functional and cognitive decline, not eating well, at times with agitation - waxing and waning encephalopathy persists. She has been seen and evaluated by physical therapy -deemed not appropriate to undergo another trial of rehabilitation. Recommendations are for long-term care facility from physical therapy colleagues.  A palliative medicine consultation has been requested for further goals of care discussions.  Patient is  resting in bed. She is able to state her name, able to state her daughter's names. She is able to recognize a cousin present in the room. She appears weak, chronically ill. Discussed briefly with cousin present at the bedside.  Call placed and discussed with daughter [name] who is noted to be to healthcare power of attorney agent. Goals wishes and values discussed. Brief life history also reviewed. Patient was living independently up until this fall in November 2018 that has accelerated her decline both functionally and cognitively, has contributed to recurrent hospitalizations. Patient reportedly made her living will and healthcare power of attorney documents a few years ago. Patient's daughter states that the patient would not want artificial nutrition and hydration - PEG tubes.  We discussed about hospice philosophy of care. We explored options home with hospice versus residential hospice. See additional discussions/recommendations below. Thank you for the consult.   HCPOA  daughter [name] [phone]  SUMMARY OF RECOMMENDATIONS    DNR DNI CSW consult for residential hospice: patient with reduced EF, functional/cognitive decline, likely not eating enough to sustain herself, waxing and waning encephalopathy, underlying dementia, prognosis likely is limited to few weeks at this point.  Choices discussed, daughter states that patient [place] a lot of family in [place], she elect[place] hosp[place] home in [place] as first choice, [facility] [place], [place] as [place] choice.   Code Status/Advance Care  Planning:  DNR    Symptom Management:   As above    Palliative Prophylaxis:   Delirium Protocol   Psycho-social/Spiritual:   Desire for further Chaplaincy support:yes  Additional Recommendations: Education on Hospice  Prognosis:   < 2 weeks  Discharge Planning: Hospice facility      Primary Diagnoses: Present on Admission: . Acute lower UTI . CAD (coronary artery  disease) . Essential hypertension . Paroxysmal A-fib (HCC): Hx of per hospitalization of 08/02/2017--[facility] . CHF (congestive heart failure), NYHA class III, acute on chronic, combined (HCC)   I have reviewed the medical record, interviewed the patient and family, and examined the patient. The following aspects are pertinent.  Past Medical History:  Diagnosis Date  . CHF (congestive heart failure) (HCC)   . CVA (cerebral vascular accident) (HCC): Hx of CVA 09/21/2017  . Diabetes mellitus without complication (HCC)   . Hypertension   . Paroxysmal A-fib [facility]): Hx of per hospitalization of 08/02/2017--[facility] 09/21/2017   Social History   Socioeconomic History  . Marital status: Legally Separated    Spouse name: Not on file  . Number of children: Not on file  . Years of education: Not on file  . Highest education level: Not on file  Occupational History  . Not on file  Social Needs  . Financial resource strain: Not on file  . Food insecurity:    Worry: Not on file    Inability: Not on file  . Transportation needs:    Medical: Not on file    Non-medical: Not on file  Tobacco Use  . Smoking status: Never Smoker  . Smokeless tobacco: Never Used  Substance and Sexual Activity  . Alcohol use: No    Frequency: Never  . Drug use: No  . Sexual activity: Not on file  Lifestyle  . Physical activity:    Days per week: Not on file    Minutes per session: Not on file  . Stress: Not on file  Relationships  . Social connections:    Talks on phone: Not on file    Gets together: Not on file    Attends religious service: Not on file    Active member of club or organization: Not on file    Attends meetings of clubs or organizations: Not on file    Relationship status: Not on file  Other Topics Concern  . Not on file  Social History Narrative  . Not on file   Family History  Problem Relation Age of Onset  . CAD Mother   . Cancer Father    Scheduled  Meds: . allopurinol  100 mg Oral Daily  . apixaban  5 mg Oral BID  . atorvastatin  80 mg Oral q1800  . docusate sodium  100 mg Oral BID  . dorzolamide  1 drop Both Eyes TID  . furosemide  40 mg Oral Daily  . Gerhardt's butt cream   Topical QID  . insulin aspart  0-12 Units Subcutaneous TID WC  . mouth rinse  15 mL Mouth Rinse BID  . Melatonin  1 mg Oral QHS  . nutrition supplement (JUVEN)  1 packet Oral BID BM  . OLANZapine  2.5 mg Oral Daily  . OLANZapine  5 mg Oral QHS  . pantoprazole  40 mg Oral Daily  . sodium chloride flush  3 mL Intravenous Q12H   Continuous Infusions: . sodium chloride    . potassium chloride 10 mEq (10/06/17 1200)  PRN Meds:.sodium chloride, nitroGLYCERIN, ondansetron **OR** ondansetron (ZOFRAN) IV, RESOURCE THICKENUP CLEAR, sodium chloride flush Medications Prior to Admission:  Prior to Admission medications   Medication Sig Start Date End Date Taking? Authorizing Provider  allopurinol (ZYLOPRIM) 100 MG tablet Take 100 mg by mouth daily.  09/13/17  Yes [provider]  atorvastatin (LIPITOR) 80 MG tablet Take 80 mg by mouth daily.  09/13/17  Yes [provider]  diltiazem (CARDIZEM) 30 MG tablet Take 1 tablet (30 mg total) by mouth every 8 (eight) hours. 09/23/17  Yes Rodolph Bong, MD  dorzolamide (TRUSOPT) 2 % ophthalmic solution Place 1 drop into both eyes 3 (three) times daily.  09/13/17  Yes [provider]  ELIQUIS 5 MG TABS tablet Take 5 mg by mouth 2 (two) times daily.  09/13/17  Yes [provider]  Maltodextrin-Xanthan Gum (RESOURCE THICKENUP CLEAR) POWD Take 120 g by mouth as needed (nectar thick). 09/23/17  Yes Rodolph Bong, MD  MELATONIN PO Take 1 tablet by mouth at bedtime.   Yes [provider]  OLANZapine (ZYPREXA) 5 MG tablet Take 5 mg by mouth daily.  09/13/17  Yes [provider]  omeprazole (PRILOSEC) 20 MG capsule Take 20 mg by mouth 2 (two) times daily before a meal.  09/13/17  Yes  [provider]  QUEtiapine (SEROQUEL) 25 MG tablet Take 0.5 tablets (12.5 mg total) by mouth at bedtime. 09/23/17  Yes Rodolph Bong, MD  nitroGLYCERIN (NITROSTAT) 0.4 MG SL tablet Place 0.4 mg under the tongue every 5 (five) minutes as needed for chest pain.  09/13/17   [provider]  NOVOLOG 100 UNIT/ML injection Inject 0-12 Units into the skin 4 (four) times daily. 0-150=0 units, 151-200=2 units, 201-250=4 units, 251-300=6 units, 301-350=8 units, 351-400=10 units, 401-450=12 units. 09/13/17   [provider]   Allergies  Allergen Reactions  . Allegra [Fexofenadine] Anaphylaxis  . Carvedilol Anaphylaxis  . Sulfamethoxazole-Trimethoprim Anaphylaxis  . Sulfanilamide Anaphylaxis  . Adhesive [Tape]     Causes Blisters.  . Ceftriaxone Diarrhea  . Cephalexin Hives  . Clindamycin/Lincomycin Hives  . Clopidogrel   . Ezetimibe Nausea Only  . Haloperidol And Related Other (See Comments)    Confusion  . Hydrocodone Hives  . Insulin Degludec     Heart Flutters  . Insulin Glargine     Headache  . Isosorbide Mononitrate [Isosorbide Dinitrate Er]     Excessive sleep  . Moxifloxacin   . Other     Phenylpropanolamin-Hydrocodone  . Prasugrel     Bleeding  . Ramipril   . Rosuvastatin Hives    Causes her heart to race and pounding in the chest.  . Tylenol [Acetaminophen]   . Atorvastatin Calcium Palpitations  . Minocycline Rash   Review of Systems +confused  Physical Exam Awake, not agitated In no distress Is able to state her name, recognizes her cousin present in the room, is able to state her daughters names.  S1 S2 Regular Abdomen distended No edema  Vital Signs: BP (!) 90/58 (BP Location: Right Arm)   Pulse (!) 104   Temp 99 F (37.2 C) (Axillary)   Resp 20   Ht 5' (1.524 m)   Wt 84.7 kg (186 lb 11.7 oz)   SpO2 93%   BMI 36.47 kg/m  Pain Scale: 0-10   Pain Score: 0-No pain   SpO2: SpO2: 93 % O2 Device:SpO2: 93 % O2 Flow Rate: .    IO: Intake/output summary:   Intake/Output Summary (  Last 24 hours) at 10/06/2017 1209 Last data filed at 10/06/2017 0600 Gross per 24 hour  Intake 1192.17 ml  Output 475 ml  Net 717.17 ml    LBM: Last BM Date: 10/03/17 Baseline Weight: Weight: 90.7 kg (200 lb) Most recent weight: Weight: 84.7 kg (186 lb 11.7 oz)     Palliative Assessment/Data:   PPS 30%  Time In:  11 Time Out:  12.10 Time Total:  70 min  Greater than 50%  of this time was spent counseling and coordinating care related to the above assessment and plan.  Signed by: Rosalin Hawking, MD  [phone]  Please contact Palliative Medicine Team phone at [phone] for questions and concerns.  For individual provider: See Loretha Stapler

## 2017-10-06 NOTE — Progress Notes (Signed)
Patient has had very little urine output since beginning of shift. Patient has only soiled one bed pad at 2230. Bladder scan read 0ml @ 0230. NP on call notified. New orders being placed. Will continue to monitor patient.

## 2017-10-07 ENCOUNTER — Inpatient Hospital Stay (HOSPITAL_COMMUNITY): Payer: Medicare Other

## 2017-10-07 LAB — BASIC METABOLIC PANEL
CO2: 28 mmol/L (ref 22–32)
Calcium: 8.4 mg/dL — ABNORMAL LOW (ref 8.9–10.3)
Creatinine, Ser: 0.75 mg/dL (ref 0.44–1.00)
GFR calc Af Amer: >60 mL/min (ref >60)
Glucose, Bld: 164 mg/dL — ABNORMAL HIGH (ref 65–99)

## 2017-10-07 LAB — BASIC METABOLIC PANEL WITH GFR
Anion gap: 17 — ABNORMAL HIGH (ref 5–15)
BUN: 8 mg/dL (ref 6–20)
Chloride: 95 mmol/L — ABNORMAL LOW (ref 101–111)
GFR calc non Af Amer: >60 mL/min (ref >60)
Potassium: 4.1 mmol/L (ref 3.5–5.1)
Sodium: 140 mmol/L (ref 135–145)

## 2017-10-07 LAB — GLUCOSE, CAPILLARY
GLUCOSE-CAPILLARY: 204 mg/dL — AB (ref 65–99)
Glucose-Capillary: 109 mg/dL — ABNORMAL HIGH (ref 65–99)
Glucose-Capillary: 150 mg/dL — ABNORMAL HIGH (ref 65–99)

## 2017-10-07 MED ORDER — OLANZAPINE 10 MG IM SOLR
2.5000 mg | Freq: Three times a day (TID) | INTRAMUSCULAR | Status: DC | PRN
  Administered 2017-10-08: 2.5 mg via INTRAMUSCULAR
  Filled 2017-10-07 (×2): qty 10

## 2017-10-07 MED ORDER — HYDROMORPHONE HCL 1 MG/ML IJ SOLN
0.5000 mg | INTRAMUSCULAR | Status: DC | PRN
  Administered 2017-10-07: 0.5 mg via INTRAVENOUS
  Filled 2017-10-07: qty 0.5

## 2017-10-07 MED ORDER — SODIUM CHLORIDE 0.9 % IV BOLUS
250.0000 mL | Freq: Once | INTRAVENOUS | Status: AC
  Administered 2017-10-07: 250 mL via INTRAVENOUS

## 2017-10-07 MED ORDER — MELATONIN 1 MG PO TABS
1.0000 mg | ORAL_TABLET | Freq: Every day | ORAL | Status: DC
  Filled 2017-10-07 (×3): qty 1

## 2017-10-07 MED ORDER — NALOXONE HCL 0.4 MG/ML IJ SOLN: 0.4000 mg | INTRAMUSCULAR | Status: DC | PRN

## 2017-10-07 MED ORDER — HYDROMORPHONE HCL 1 MG/ML IJ SOLN: 0.2000 mg | INTRAMUSCULAR | Status: DC | PRN

## 2017-10-07 NOTE — Clinical Social Work Note (Signed)
Patient recently assessed on 09/22/2017 no psychosocial changes, please see assessment below. CSW consulted for residential hospice. CSW spoke with patient's daughter Stacy Dorsey([name]) regarding discharge plans, patient oriented x person and unable to participate in discussion. Patient's daughter confirmed plan for patient to discharge to residential hospice, [place] [facility] [facility] [place] [facility]. Patient's daughter reported that patient has been transitioning frequently between the hospital, home, daughter's home and rehab since November 2018 and that the patient is too hard to care for in the home at this point.Patient's daughter reported that she is patient's Medical POA, CSW requested that patient's daughter bring a copy of the paperwork to the hospital. CSW agreed to make referral to [facility].  CSW made referral to [facility]. CSW will continue to follow and assist with discharge planning.  Clinical Social Work Assessment  Patient Details  Name: Stacy Dorsey MRN: [id number] Date of Birth: 28-Oct-1945  Date of referral:   10/07/2017               Reason for consult:   End of life/Hospice                Permission sought to share information with:    Permission granted to share information::     Name::        Agency::     Relationship::     Contact Information:     Housing/Transportation Living arrangements for the past 2 months:    Single Family Home(however has been mostly in SNFs or in hospital since 05/2017 per daughters) Source of Information:    Adult Children Patient Interpreter Needed:   None Criminal Activity/Legal Involvement Pertinent to Current Situation/Hospitalization:   No-comment as needed Significant Relationships:   Adult Children Lives with:   Adult children(and grandchildren) Do you feel safe going back to the place where you live?   Palliative recommending residential hospice Need for family participation in patient  care:   Yes (daughter is primary caretaker)   Care giving concerns:  Pt admitted from home where she resides with her daughter and granddaughter. Had only been there about 5 days per daughter, prior to that had been in rehab at [facility] for about a month. Prior to that "was in [facility]TI for about 2 weeks." Prior to that was [facility] for fractured femur. Daughters state, "She was living at home alone, driving, doing everything herself until she fell and fractured her femur in November 2018. Since then she has been in the hospital 3 times and in rehab twice. She gets confused and disoriented everytime she has a UTI, has had 7 in the past 4 months, and she is setback from all the progress she made." Daughter state that pt was [facility] RN/PT/OT upon DC home from [facility] last week.  Pt's daughter, son-in-law, and granddaughter assist with her care at home. Stacy Dorsey term goal is to have pt be able to return to her own home if family can coordinate a caretaker being there with her 24/7.    Social Worker assessment / plan:  CSW consulted to assist with disposition.  Pt admitted from home however was home less than a week following DC from SNF ([facility]). Discussed care needs above with pt's daughters at bedside as pt was soundy sleeping. Pt's daughter reports that pt has used all Medicare coverage  days for SNF and owes $1000 to [facility] as pt was in copay days by the time she was discharged. Explains pt has Medicaid as well as Medicare, however not special-assistance Medicaid (facility medicaid), therefore pt also "had to pay them her social security check for the month." Daughters state that pt and family has been very unhappy with pt's care at multiple SNFs "and we promised her we would take her home this time instead of back to a SNF. Honestly she just has not way of affording  it anymore." Pt's daughter states she plans to have pt come home (notes that they are trying to build a ramp into the home to assist with transporting pt) with multiple family members assisting with her care. Also want to continue with [facility].   Plan: to return to daughter's home at DC. Notes she may need PTAR at DC for safe transport. Wants to continue [facility].   Employment status:   Retired Health and safety inspector:   Medicare,[place]icaid [place] PT Recommendations:    Other ([facility] term care placement)   Information / Referral to community resources:   Referral to residential hospice facility   Patient/Family's Response to care:  Daughter state "we love [facility], but we want to make sure this infection goes away. We feel like something is wrong to make her keep having these infections.   Patient/Family's Understanding of and Emotional Response to Diagnosis, Current Treatment, and Prognosis:  Pt unable to demonstrate understanding- sleeping, and has not been oriented to situation per daughters (this is not her baseline the report) Daughters demonstrate good understanding of treatment and plan. Are reasonable in their expectations for pt's care needs at home. Ask pertinent questions. "We want to honor her wish to go home. Rehab has not worked for her either so we will give her the chance to try at home."  Emotional Assessment Appearance:    Attitude/Demeanor/Rapport:   Unable to assess Affect (typically observed):   Unable to assess Orientation:   Oriented to person Alcohol / Substance use:   N/A Psych involvement (Current and /or in the community):   Per chart review, Patient was seen by Psych on 3/22 for agitation. Disposition: Patient does not meet criteria for psychiatric inpatient admission.  Discharge Needs  Concerns to be addressed:   Care Coordination  Readmission within the last 30 days:   Yes Current discharge risk:   Terminally Ill Barriers to  Discharge:   None identified    Antionette Poles, LCSW 10/07/2017, 10:05 AM  [phone]

## 2017-10-07 NOTE — Progress Notes (Signed)
Daily Progress Note   Patient Name: Stacy Dorsey       Date: 10/07/2017 DOB: Aug 27, 1945  Age: 72 y.o. MRN#: [id number] Attending Physician: Delaine Lame, MD Primary Care Physician: Patient, No Pcp Per Admit Date: 10/01/2017  Reason for Consultation/Follow-up: Hospice Evaluation  Subjective:  patient is awake but not alert She is mumbling incoherently Grand daughter at bedside Patient slid out of bed last night, CT head with no acute changes, showing chronic small vessel disease changes. Is not eating/drinking well.  Remains confused.   Length of Stay: 6  Current Medications: Scheduled Meds:  . allopurinol  100 mg Oral Daily  . apixaban  5 mg Oral BID  . atorvastatin  80 mg Oral q1800  . docusate sodium  100 mg Oral BID  . dorzolamide  1 drop Both Eyes TID  . furosemide  40 mg Oral Daily  . Gerhardt's butt cream   Topical QID  . insulin aspart  0-12 Units Subcutaneous TID WC  . mouth rinse  15 mL Mouth Rinse BID  . Melatonin  1 mg Oral QHS  . nutrition supplement (JUVEN)  1 packet Oral BID BM  . OLANZapine  2.5 mg Oral Daily  . OLANZapine  5 mg Oral QHS  . pantoprazole  40 mg Oral Daily  . sodium chloride flush  3 mL Intravenous Q12H    Continuous Infusions: . sodium chloride      PRN Meds: sodium chloride, nitroGLYCERIN, OLANZapine, ondansetron **OR** ondansetron (ZOFRAN) IV, RESOURCE THICKENUP CLEAR, sodium chloride flush  Physical Exam         Awake, but not alert Has been rest less and agitated at times Mumbles incoherently  S1 S2 Regular Abdomen distended No edema   Vital Signs: BP 103/76 (BP Location: Right Arm)   Pulse (!) 131   Temp 99.6 F (37.6 C) (Axillary)   Resp 20   Ht 5' (1.524 m)   Wt 84.7 kg (186 lb 11.7 oz)   SpO2 98%   BMI 36.47  kg/m  SpO2: SpO2: 98 % O2 Device: O2 Device: Room Air O2 Flow Rate:    Intake/output summary:   Intake/Output Summary (Last 24 hours) at 10/07/2017 1052 Last data filed at 10/07/2017 0600 Gross per 24 hour  Intake 500 ml  Output -  Net 500 ml  LBM: Last BM Date: 10/06/17 Baseline Weight: Weight: 90.7 kg (200 lb) Most recent weight: Weight: 84.7 kg (186 lb 11.7 oz)       Palliative Assessment/Data:      Patient Active Problem List   Diagnosis Date Noted  . Pressure injury of coccygeal region, stage 1 10/05/2017  . Delirium due to another medical condition 10/03/2017  . CHF (congestive heart failure), NYHA class III, acute on chronic, combined (HCC) 10/01/2017  . Paroxysmal A-fib (HCC): Hx of per hospitalization of 08/02/2017--[facility] 09/21/2017  . CVA (cerebral vascular accident) (HCC): Hx of CVA 09/21/2017  . Pressure injury of skin 09/19/2017  . Acute cystitis without hematuria   . Acute encephalopathy 09/18/2017  . Acute lower UTI 09/18/2017  . CAD (coronary artery disease) 09/18/2017  . CHF exacerbation (HCC) 09/18/2017  . Essential hypertension 09/18/2017  . Sepsis (HCC) 09/17/2017    Palliative Care Assessment & Plan   Patient Profile:    Assessment:  Systolic HF EF 20% on recent ECHO PAF HTN DM HLD CAD Dementia, history of CVA Recent UTI Functional and cognitive decline since the past few months.  PPS 20%  Recommendations/Plan:  Add low dose IV Dilaudid PRN for pain  Continue current medications  Recommend residential hospice setting for symptom management.     Code Status:    Code Status Orders  (From admission, onward)        Start     Ordered   10/02/17 0019  Do not attempt resuscitation (DNR)  Continuous    Question Answer Comment  In the event of cardiac or respiratory ARREST Do not call a "code blue"   In the event of cardiac or respiratory ARREST Do not perform Intubation, CPR, defibrillation or ACLS   In the event  of cardiac or respiratory ARREST Use medication by any route, position, wound care, and other measures to relive pain and suffering. May use oxygen, suction and manual treatment of airway obstruction as needed for comfort.      10/02/17 0018    Code Status History    Date Active Date Inactive Code Status Order ID Comments User Context   10/01/2017 2256 10/02/2017 0018 Full Code [id number]  Rometta EmeryGarba, Mohammad L, MD Inpatient   10/01/2017 2212 10/01/2017 2255 DNR [id number]  Rometta EmeryGarba, Mohammad L, MD ED   09/18/2017 0112 09/23/2017 2118 Full Code [id number]  Eduard ClosKakrakandy, Arshad N, MD ED    Advance Directive Documentation     Most Recent Value  Type of Advance Directive  Healthcare Power of Attorney  Pre-existing out of facility DNR order (yellow form or pink MOST form)  -  "MOST" Form in Place?  -       Prognosis:   < 2 weeks  Discharge Planning:  Hospice facility  Care plan was discussed with  Patient's RN. Also discussed with grand daughter present in the room.    Thank you for allowing the Palliative Medicine Team to assist in the care of this patient.   Time In: 9 Time Out: 9.35 Total Time 35 Prolonged Time Billed  no       Greater than 50%  of this time was spent counseling and coordinating care related to the above assessment and plan.  Rosalin HawkingZeba Chanti Golubski, MD [phone]  Please contact Palliative Medicine Team phone at [phone] for questions and concerns.

## 2017-10-07 NOTE — Consult Note (Signed)
[facility]:  TC to speak with pt's daughter and confirm interest in [facility]. She does confirm interest. We unfortunately do not have a bed available at this time. We have tentatively set up a time to meet at 1030am with daughter to evaluate [facility] if bed available. Notified CDW CorporationKimberly Long. Please call with questions or concerns. Thank you for this referral. [phone]  Norm Parcelheri Kennedy RN

## 2017-10-07 NOTE — Progress Notes (Signed)
   10/07/17 1554  Clinical Encounter Type  Visited With Patient and family together  Visit Type Follow-up  Spiritual Encounters  Spiritual Needs Emotional;Prayer  Stress Factors  Family Stress Factors Health changes   Rounding on Palliative patients and following up on this particular patient.  Daughter and granddaughter at bedside.  Patient was sleeping and unaware of my presence.  Daughter indicated patient being [facility].  She appeared very sad that all this has happened so quickly that since November her mom has been in and out of the hospital.  Provided empathetic listening and compassion.  We prayed together.  Will follow and support as needed. Chaplain Agustin CreeNewton Meryn Sarracino

## 2017-10-07 NOTE — Progress Notes (Signed)
PROGRESS NOTE    Stacy Dorsey  POE:[id number] DOB: [birth year] DOA: 10/01/2017 PCP: Patient, No Pcp Per   Brief Narrative:   72 year old with past medical history relevant for systolic heart failure EF of 20% by echo on 10/03/2017, hypertension, paroxysmal atrial fibrillation on apixaban, gout, hyperlipidemia, type 2 diabetes, coronary artery disease status post multiple stents, prior CVA, dementia admitted with acute systolic congestive heart failure exacerbation.  Her course has been complicated by chronic delirium for which apparently has been ongoing for almost now 6 months.  Her family has transitioned her to hospice and she is pending placement for this.   Assessment & Plan:   Principal Problem:   Delirium due to another medical condition Active Problems:   Acute lower UTI   CAD (coronary artery disease)   CHF exacerbation (HCC)   Essential hypertension   Paroxysmal A-fib (HCC): Hx of per hospitalization of 08/02/2017--[facility]   CHF (congestive heart failure), NYHA class III, acute on chronic, combined (HCC)   Pressure injury of coccygeal region, stage 1  #) dementia/agitation: Patient continues to be persistently agitated and delirious.  Her family is opted to do inpatient hospice and she is pending placement for this.  Currently their biggest concern is that she has some left-sided hip pain as she had a fall last night.  They have also decided to continue her cardiac medications as they do not want to "hasten her death". -Psychiatry consulted -continue olanzapine 2.5 mg every morning and 5 mg nightly -Continue melatonin 1 mg nightly next line -Palliative care consult has discussed with the patient and she is pending placement in hospice at hosp[facility] home at [facility]. -X-ray of left hip - Hold opiates as patient is very sensitive  #) Acute systolic congestive heart failure exacerbation: Resolved with IV diuresis -Continue oral furosemide 40 mill grams daily -2 g  sodium restricted diet, weigh daily, strict ins and outs, fluid restriction  #) Recurrent UTIs: UA here is negative  #) Paroxysmal atrial fibrillation: Family would like to continue her anticoagulation for now. -Continue apixaban 5 mg twice daily -Continue to hold diltiazem 30 mg every 8 hours  #) Gout: -Continue allopurinol 100 mg daily  #) Hyperlipidemia: -Continue atorvastatin 80 mg daily  #) Coronary artery disease status post multiple stents: - Continue atorvastatin -Apixaban for anticoagulation  #)  type 2 diabetes: -Continue sliding scale insulin here however will check blood sugars only once a day  Fluids: Restrict Elect lites: Monitor and support Nutrition: Sodium restricted fluid restricted diet  Prophylaxis: Apixaban  Disposition: Pending bed opening at hospice home at [facility]  DO NOT RESUSCITATE  Consultants:   Psychiatry  Palliative care  Procedures: (Don't include imaging studies which can be auto populated. Include things that cannot be auto populated i.e. Echo, Carotid and venous dopplers, Foley, Bipap, HD, tubes/drains, wound vac, central lines etc) Echo 10/03/2017: - Left ventricle: LVEF is severely depressed at approximately 20%   The cavity size was mildly dilated. Wall thickness was increased   in a pattern of mild LVH. - Mitral valve: There was mild regurgitation. - Right ventricle: The cavity size was mildly dilated. - Right atrium: The atrium was mildly dilated.  - Pulmonary arteries: PA peak pressure: 45 mm Hg (S).  Antimicrobials: (specify start and planned stop date. Auto populated tables are space occupying and do not give end dates)  IV ceftriaxone 3/19 through 10/06/2017   Subjective: Patient cannot provide any history.  She is lying in bed.  She does awaken to her name however continues to be delirious.  Last night apparently she slid out of bed because the bed alarm malfunctioned.  A stat head CT was  negative.  Objective: Vitals:   10/07/17 0233 10/07/17 0322 10/07/17 0557 10/07/17 1310  BP: (!) 93/59 (!) 90/58 103/76 126/69  Pulse: (!) 111  (!) 131 (!) 115  Resp:   20 19  Temp:   99.6 F (37.6 C) 98.9 F (37.2 C)  TempSrc:   Axillary Oral  SpO2: 97%  98% 91%  Weight:      Height:        Intake/Output Summary (Last 24 hours) at 10/07/2017 1449 Last data filed at 10/07/2017 1300 Gross per 24 hour  Intake 620 ml  Output 2 ml  Net 618 ml   Filed Weights   10/01/17 1710 10/01/17 1718 10/01/17 2256  Weight: 90.7 kg (200 lb) 90.7 kg (200 lb) 84.7 kg (186 lb 11.7 oz)    Examination:  General exam: Agitated in no acute distress Respiratory system: Clear to auscultation.  Mildly hypopneic.  No increased work of breathing, no crackles, wheezes, rales Cardiovascular system: Distant heart sounds, regular rate and rhythm, no murmurs Gastrointestinal system: Soft, nondistended, nontender, plus bowel sounds Central nervous system: Patient is not alert or oriented.  She has no focal neurological deficits on exam.  She cannot participate with a complete CNS exam Extremities: No lower extremity edema Skin: No rashes on visible skin Psychiatry: Unable to participate due to patient condition    Data Reviewed: I have personally reviewed following labs and imaging studies  CBC: Recent Labs  Lab 10/01/17 1739 10/04/17 0754 10/06/17 0515  WBC 11.1* 8.6 8.4  NEUTROABS 8.7* 5.3 5.1  HGB 12.7 14.1 11.9*  HCT 41.8 43.6 37.7  MCV 86.9 83.5 84.3  PLT 202 134* 134*   Basic Metabolic Panel: Recent Labs  Lab 10/01/17 1739 10/04/17 0754 10/04/17 2350 10/05/17 0405 10/06/17 0515 10/06/17 1351 10/07/17 0627  NA 142 141  --  141 140  --  140  K 4.8 2.6* 3.1* 4.1 2.5* 3.6 4.1  CL 104 95*  --  97* 95*  --  95*  CO2 26 34*  --  32 32  --  28  GLUCOSE 136* 85  --  103* 103*  --  164*  BUN 11 6  --  7 7  --  8  CREATININE 0.74 0.60  --  0.65 0.63  --  0.75  CALCIUM 8.8* 8.1*  --   8.0* 7.7*  --  8.4*   GFR: Estimated Creatinine Clearance: 63.2 mL/min (by C-G formula based on SCr of 0.75 mg/dL). Liver Function Tests: Recent Labs  Lab 10/01/17 1909  AST 46*  ALT 14  ALKPHOS 144*  BILITOT 1.2  PROT 6.3*  ALBUMIN 2.5*   No results for input(s): LIPASE, AMYLASE in the last 168 hours. No results for input(s): AMMONIA in the last 168 hours. Coagulation Profile: No results for input(s): INR, PROTIME in the last 168 hours. Cardiac Enzymes: Recent Labs  Lab 10/01/17 1739 10/02/17 0009 10/02/17 1443 10/04/17 2350  TROPONINI 0.04* 0.04* 0.07*  0.07* 0.07*   BNP (last 3 results) No results for input(s): PROBNP in the last 8760 hours. HbA1C: No results for input(s): HGBA1C in the last 72 hours. CBG: Recent Labs  Lab 10/06/17 1623 10/06/17 2037 10/06/17 2337 10/07/17 0736 10/07/17 1147  GLUCAP 147* 133* 154* 150* 204*   Lipid Profile: No results for input(s):  CHOL, HDL, LDLCALC, TRIG, CHOLHDL, LDLDIRECT in the last 72 hours. Thyroid Function Tests: No results for input(s): TSH, T4TOTAL, FREET4, T3FREE, THYROIDAB in the last 72 hours. Anemia Panel: No results for input(s): VITAMINB12, FOLATE, FERRITIN, TIBC, IRON, RETICCTPCT in the last 72 hours. Sepsis Labs: No results for input(s): PROCALCITON, LATICACIDVEN in the last 168 hours.  Recent Results (from the past 240 hour(s))  Urine culture     Status: None   Collection Time: 10/01/17  5:23 PM  Result Value Ref Range Status   Specimen Description   Final    URINE, RANDOM Performed at [facility], [address], [place], [place] [address]    Special Requests   Final    NONE Performed at [facility], [address], [place], [place] [address]    Culture   Final    NO GROWTH Performed at [facility], [address], [place], [place] [address]    Report Status 10/03/2017 FINAL  Final         Radiology Studies: Ct Head Wo Contrast  Result  Date: 10/07/2017 CLINICAL DATA:  Fall with head trauma and unequal pupils. EXAM: CT HEAD WITHOUT CONTRAST TECHNIQUE: Contiguous axial images were obtained from the base of the skull through the vertex without intravenous contrast. COMPARISON:  Head CT 09/28/2016 FINDINGS: Brain: No mass lesion, intraparenchymal hemorrhage or extra-axial collection. No evidence of acute cortical infarct. Periventricular hypoattenuation suggesting chronic microvascular disease. Vascular: No hyperdense vessel or unexpected vascular calcification. Skull: Normal visualized skull base, calvarium and extracranial soft tissues. Sinuses/Orbits: No sinus fluid levels or advanced mucosal thickening. No mastoid effusion. Normal orbits. IMPRESSION: Chronic small vessel disease without acute intracranial abnormality. Electronically Signed   By: Deatra Robinson M.D.   On: 10/07/2017 01:47        Scheduled Meds: . allopurinol  100 mg Oral Daily  . apixaban  5 mg Oral BID  . atorvastatin  80 mg Oral q1800  . docusate sodium  100 mg Oral BID  . dorzolamide  1 drop Both Eyes TID  . furosemide  40 mg Oral Daily  . Gerhardt's butt cream   Topical QID  . insulin aspart  0-12 Units Subcutaneous TID WC  . mouth rinse  15 mL Mouth Rinse BID  . Melatonin  1 mg Oral QHS  . nutrition supplement (JUVEN)  1 packet Oral BID BM  . OLANZapine  2.5 mg Oral Daily  . OLANZapine  5 mg Oral QHS  . pantoprazole  40 mg Oral Daily  . sodium chloride flush  3 mL Intravenous Q12H   Continuous Infusions: . sodium chloride       LOS: 6 days    Time spent: 30    Delaine Lame, MD [facility]  If 7PM-7AM, please contact night-coverage [email or web address] Password [facility] 10/07/2017, 2:49 PM

## 2017-10-07 NOTE — Progress Notes (Signed)
CSW informed by patient's RNCM that patient's daughter wanted to speak with CSW. CSW followed up with patient's daughter, patient's daughter reported that they are scheduled to meet with [facility] tomorrow 10/08/17 at 10:30.  CSW contacted by [facility] hospital liaison Cheri and informed that a meeting has been scheduled with patient's daughter tomorrow at 10:30. Hospital liaison reported that patient is [facility] on [facility].  CSW will continue to follow and assist with discharge planning.  Celso SickleKimberly Avrianna Smart, [place] Clinical Social Worker [facility] Cell#: [phone]

## 2017-10-07 NOTE — Progress Notes (Signed)
Patient was found sitting at edge of bed on the floor. Prior to this, she was in the low airloss mattress bed in the lowest position. This RN and the nurse tech, Angelique BlonderDenise had just recently finished getting patient cleaned up and gotten her vital signs/given meds when we were informed by another nurse tech of her being on the floor. She has been confused and yelling out repeatedly throughout the shift prior to this event. She continued to call out for "help" and mention her family members names after the fall. There are no visible signs of injury, patient had bruising to all extremities prior to this. She was sitting upright leaning against the side of her bed, no reason to believe she hit her head, but unsure as the fall was unwitnessed. Clance Boll. Bodenheimer, NP made aware of situation at 2145. BP was 85/52 so 500mL bolus was ordered and given. Pupils were not equal, CT scan of head was ordered and performed. Negative results. Patients daughter [name], was informed of the patients fall at 2222. Patients brother Onalee HuaDavid, and ex sister in law, Valentina GuLucy, came to visit the patient shortly after this event. Patient not complaining of any pain, vital signs were obtained frequently. Neuro checks performed. Bed in lowest position, bed alarm is on, patients door is open, call bell is within reach. Floor mats were placed on each side of patients bed. Will continue to monitor patient closely.

## 2017-10-07 NOTE — Progress Notes (Signed)
10/06/17 2146  What Happened  Was fall witnessed? No  Was patient injured? No  Patient found on floor  Found by Staff-comment Merilynn Finland)  Stated prior activity other (comment) (Unsure, pt possibly trying to get out of bed w/out help)  Follow Up  MD notified Stevie Kern, NP  Time MD notified 2145  Family notified Yes-comment (Daughter Neysa Bonito notified, brother david visited aft)  Time family notified 2222  Additional tests Yes-comment (CT scan of head)  Simple treatment Other (comment) (none needed)  Adult Fall Risk Assessment  Risk Factor Category (scoring not indicated) Fall has occurred during this admission (document High fall risk)  Patient's Fall Risk High Fall Risk (>13 points)  Adult Fall Risk Interventions  Required Bundle Interventions *See Row Information* High fall risk - low, moderate, and high requirements implemented  Additional Interventions Use of appropriate toileting equipment (bedpan, BSC, etc.);Room near nurses station;Family Supervision (family here at times)  Screening for Fall Injury Risk (To be completed on HIGH fall risk patients) - Assessing Need for Low Bed  Risk For Fall Injury- Low Bed Criteria Previous fall this admission  Will Implement Low Bed and Floor Mats Yes (low bed already in place)  Screening for Fall Injury Risk (To be completed on HIGH fall risk patients who do not meet crieteria for Low Bed) - Assessing Need for Floor Mats Only  Risk For Fall Injury- Criteria for Floor Mats Bleeding risk-anticoagulation (not prophylaxis);Confusion/dementia (+CAM, CIWA, TBI, etc.);Noncompliant with safety precautions  Will Implement Floor Mats Yes (placed )  Vitals  Temp 97.7 F (36.5 C)  Temp Source Oral  BP (!) 85/52  MAP (mmHg) (!) 61  BP Location Right Arm  BP Method Automatic  Patient Position (if appropriate) Lying  Pulse Rate (!) 118  Pulse Rate Source Dinamap  Resp 18  Oxygen Therapy  SpO2 94 %  O2 Device Room Air  PAINAD  (Pain Assessment in Advanced Dementia)  Breathing 0  Negative Vocalization 1  Facial Expression 0  Body Language 0  Consolability 1  PAINAD Score 2  PCA/Epidural/Spinal Assessment  Respiratory Pattern Regular;Unlabored  Neurological  Neuro (WDL) X  Level of Consciousness Responds to Voice  Orientation Level Oriented to person;Disoriented to place;Disoriented to time;Disoriented to situation  Cognition Poor judgement;Poor safety awareness;Impulsive;Poor attention/concentration;Memory impairment  Speech Slurred/Dysarthria (prior to fall as well)  Pupil Assessment  Yes  R Pupil Size (mm) 2  R Pupil Shape Round  R Pupil Reaction Nonreactive  L Pupil Size (mm) 3  L Pupil Shape Round  L Pupil Reaction Brisk  Motor Function/Sensation Assessment Grip  R Hand Grip Present;Weak  L Hand Grip Present;Weak   RUE Motor Response Purposeful movement  RUE Motor Strength 4  LUE Motor Response Purposeful movement  LUE Motor Strength 4  RLE Motor Response Purposeful movement  RLE Motor Strength 4  LLE Motor Response Purposeful movement  LLE Motor Strength 4  Neuro Symptoms Agitation;Anxiety  Glasgow Coma Scale  Eye Opening 4  Best Motor Response 5  Best Verbal Response (NON-intubated) 3  Glasgow Coma Scale Score 12  Musculoskeletal  Musculoskeletal (WDL) X  Generalized Weakness Yes  Weight Bearing Restrictions No  Integumentary  Integumentary (WDL) X  Skin Color Pale  Skin Condition Dry;Flaky  Skin Integrity Ecchymosis;Skin tear;Weeping;MASD;Abrasion;Other (Comment)  Abrasion Location Hand  Abrasion Location Orientation Right;Left  Ecchymosis Location Arm;Back;Leg  Ecchymosis Location Orientation Bilateral  Moisture Associated Skin Damage Location Abdomen;Groin;Buttocks  Moisture Associated Skin Damage Orientation Bilateral  Moisture Associated Skin  Damage Intervention Other (Comment) (cream, cleansed, interdry)  Skin Tear Location Arm  Skin Tear Location Orientation Left  Skin  Tear Intervention Gauze  Weeping Location Arm  Weeping Location Orientation Left  Weeping Intervention Other (Comment) (vaseline gauze, kerlix)  Skin Turgor Non-tenting

## 2017-10-08 DIAGNOSIS — Z515 Encounter for palliative care: Secondary | ICD-10-CM

## 2017-10-08 DIAGNOSIS — M79606 Pain in leg, unspecified: Secondary | ICD-10-CM

## 2017-10-08 LAB — GLUCOSE, CAPILLARY: Glucose-Capillary: 154 mg/dL — ABNORMAL HIGH (ref 65–99)

## 2017-10-08 MED ORDER — FENTANYL CITRATE (PF) 100 MCG/2ML IJ SOLN
12.5000 ug | INTRAMUSCULAR | Status: DC | PRN
  Administered 2017-10-08: 12.5 ug via INTRAVENOUS
  Filled 2017-10-08: qty 2

## 2017-10-08 MED ORDER — HALOPERIDOL LACTATE 5 MG/ML IJ SOLN
0.5000 mg | INTRAMUSCULAR | Status: DC | PRN
  Administered 2017-10-08 – 2017-10-09 (×4): 0.5 mg via INTRAVENOUS
  Filled 2017-10-08 (×4): qty 1

## 2017-10-08 MED ORDER — FENTANYL CITRATE (PF) 100 MCG/2ML IJ SOLN
12.5000 ug | INTRAMUSCULAR | Status: DC | PRN
  Administered 2017-10-08 (×2): 25 ug via INTRAVENOUS
  Filled 2017-10-08 (×2): qty 2

## 2017-10-08 MED ORDER — OXYCODONE HCL 5 MG PO TABS: 5.0000 mg | ORAL_TABLET | ORAL | Status: DC | PRN

## 2017-10-08 NOTE — Progress Notes (Signed)
CSW following to assist patient with discharge to residential hospice.   CSW notified by [facility] hospital liaison Cheri that no beds are available. Hospital liaison reported that they may have beds available tomorrow.   CSW updated patient's daughter and inquired about making a referral to their second hosp[facility]. Patient's daughter reported that the facility is too far and no one would be able to visit patient. CSW inquired about patient's [facility], patient's daughter reported that she is not interested in that facility. CSW agreed to follow up with [facility] to see if any beds may become available today.   CSW will continue to follow and assist with discharge planning.  Celso SickleKimberly Taino Dorsey, [place] Clinical Social Worker [facility] Cell#: [phone]

## 2017-10-08 NOTE — Care Management Important Message (Signed)
Important Message  Patient Details IM Letter given to Nora/Case Manager to present to Patient Name: Stacy Dorsey MRN: [id number] Date of Birth: 04-11-1946   Medicare Important Message Given:  Yes    Caren MacadamFuller, Crescentia Boutwell 10/08/2017, 11:30 AMImportant Message  Patient Details  Name: Stacy Dorsey MRN: [id number] Date of Birth: 04-11-1946   Medicare Important Message Given:  Yes    Caren MacadamFuller, Chanique Duca 10/08/2017, 11:30 AM

## 2017-10-08 NOTE — Progress Notes (Signed)
Daily Progress Note   Patient Name: Stacy Dorsey       Date: 10/08/2017 DOB: 06-03-1946  Age: 72 y.o. MRN#: [id number] Attending Physician: Delaine LamePurohit, Shrey C, MD Primary Care Physician: Patient, No Pcp Per Admit Date: 10/01/2017  Reason for Consultation/Follow-up: Hospice Evaluation  Subjective: Patient is awake but not alert She is mumbling incoherently- moaning and crying "help me" No family at bedside Not eating.  Taking some sips.  Length of Stay: 7  Current Medications: Scheduled Meds:  . allopurinol  100 mg Oral Daily  . apixaban  5 mg Oral BID  . atorvastatin  80 mg Oral q1800  . docusate sodium  100 mg Oral BID  . dorzolamide  1 drop Both Eyes TID  . furosemide  40 mg Oral Daily  . Gerhardt's butt cream   Topical QID  . mouth rinse  15 mL Mouth Rinse BID  . Melatonin  1 mg Oral QHS  . nutrition supplement (JUVEN)  1 packet Oral BID BM  . OLANZapine  2.5 mg Oral Daily  . OLANZapine  5 mg Oral QHS  . pantoprazole  40 mg Oral Daily  . sodium chloride flush  3 mL Intravenous Q12H    Continuous Infusions: . sodium chloride      PRN Meds: sodium chloride, fentaNYL (SUBLIMAZE) injection, haloperidol lactate, naLOXone (NARCAN)  injection, nitroGLYCERIN, OLANZapine, ondansetron **OR** ondansetron (ZOFRAN) IV, oxyCODONE, RESOURCE THICKENUP CLEAR, sodium chloride flush  Physical Exam         Awake, but not alert Has been rest less and agitated at times Mumbles incoherently  S1 S2 Regular Abdomen distended No edema   Vital Signs: BP 92/68 (BP Location: Right Arm)   Pulse (!) 118   Temp 98.5 F (36.9 C) (Axillary)   Resp 16   Ht 5' (1.524 m)   Wt 84.7 kg (186 lb 11.7 oz)   SpO2 100%   BMI 36.47 kg/m  SpO2: SpO2: 100 % O2 Device: O2 Device: Room Air O2 Flow  Rate:    Intake/output summary:   Intake/Output Summary (Last 24 hours) at 10/08/2017 0916 Last data filed at 10/07/2017 1700 Gross per 24 hour  Intake 120 ml  Output -  Net 120 ml   LBM: Last BM Date: 10/07/17 Baseline Weight: Weight: 90.7 kg (200 lb) Most recent weight: Weight:  84.7 kg (186 lb 11.7 oz)       Palliative Assessment/Data:    Flowsheet Rows     Most Recent Value  Intake Tab  Referral Department  Hospitalist  Unit at Time of Referral  Med/Surg Unit  Palliative Care Primary Diagnosis  Cardiac  Date Notified  10/05/17  Palliative Care Type  New Palliative care  Reason for referral  Clarify Goals of Care, Counsel Regarding Hospice  Date of Admission  10/01/17  Date first seen by Palliative Care  10/06/17  # of days Palliative referral response time  1 Day(s)  # of days IP prior to Palliative referral  4  Clinical Assessment  Psychosocial & Spiritual Assessment  Palliative Care Outcomes      Patient Active Problem List   Diagnosis Date Noted  . Pressure injury of coccygeal region, stage 1 10/05/2017  . Delirium due to another medical condition 10/03/2017  . CHF (congestive heart failure), NYHA class III, acute on chronic, combined (HCC) 10/01/2017  . Paroxysmal A-fib (HCC): Hx of per hospitalization of 08/02/2017--[facility] 09/21/2017  . CVA (cerebral vascular accident) (HCC): Hx of CVA 09/21/2017  . Pressure injury of skin 09/19/2017  . Acute cystitis without hematuria   . Acute encephalopathy 09/18/2017  . Acute lower UTI 09/18/2017  . CAD (coronary artery disease) 09/18/2017  . CHF exacerbation (HCC) 09/18/2017  . Essential hypertension 09/18/2017  . Sepsis (HCC) 09/17/2017    Palliative Care Assessment & Plan   Patient Profile:    Assessment:  Systolic HF EF 20% on recent ECHO PAF HTN DM HLD CAD Dementia, history of CVA Recent UTI Functional and cognitive decline since the past few months.  PPS  20%  Recommendations/Plan:  Pain: moaning on exam.  Increased fentanyl to 12.5-25mcg Q 1 hour as needed.  Agitation: Agree with zyprexa.  Also made addition of haldol as needed.  Symptoms still not controlled. Recommend residential hospice setting for symptom management.     Code Status:    Code Status Orders  (From admission, onward)        Start     Ordered   10/02/17 0019  Do not attempt resuscitation (DNR)  Continuous    Question Answer Comment  In the event of cardiac or respiratory ARREST Do not call a "code blue"   In the event of cardiac or respiratory ARREST Do not perform Intubation, CPR, defibrillation or ACLS   In the event of cardiac or respiratory ARREST Use medication by any route, position, wound care, and other measures to relive pain and suffering. May use oxygen, suction and manual treatment of airway obstruction as needed for comfort.      10/02/17 0018     Prognosis:   < 2 weeks  Discharge Planning:  Hospice facility  Care plan was discussed with  Patient's RN.  Thank you for allowing the Palliative Medicine Team to assist in the care of this patient.  Total time: 25 minutes    Greater than 50%  of this time was spent counseling and coordinating care related to the above assessment and plan.  Romie Minus, MD [facility] Palliative Medicine Team [phone]  Please contact Palliative Medicine Team phone at [phone] for questions and concerns.

## 2017-10-08 NOTE — Progress Notes (Signed)
PROGRESS NOTE    Stacy Dorsey  ZOX:[id number] DOB: 1946-07-06 DOA: 10/01/2017 PCP: Patient, No Pcp Per   Brief Narrative:   72 year old with past medical history relevant for systolic heart failure EF of 20% by echo on 10/03/2017, hypertension, paroxysmal atrial fibrillation on apixaban, gout, hyperlipidemia, type 2 diabetes, coronary artery disease status post multiple stents, prior CVA, dementia admitted with acute systolic congestive heart failure exacerbation.  Her course has been complicated by chronic delirium for which apparently has been ongoing for almost now 6 months.  Her family has transitioned her to hospice and she is pending placement for this.   Assessment & Plan:   Principal Problem:   Delirium due to another medical condition Active Problems:   Acute lower UTI   CAD (coronary artery disease)   CHF exacerbation (HCC)   Essential hypertension   Paroxysmal A-fib (HCC): Hx of per hospitalization of 08/02/2017--[facility]   CHF (congestive heart failure), NYHA class III, acute on chronic, combined (HCC)   Pressure injury of coccygeal region, stage 1  #) dementia/agitation: Patient continues to be persistently agitated and delirious.  She is also having some pain in her left hip however x-ray if this is not shown any acute process. -Psychiatry consulted -continue olanzapine 2.5 mg every morning and 5 mg nightly -Continue melatonin 1 mg nightly  -Palliative care consult, appreciate recommendations -Continue PRN Haldol for breakthrough agitation per palliative care -Fentanyl 12.525 mg every 2 hours as needed, oxycodone 5 mg every 4 hours as needed  #) Acute systolic congestive heart failure exacerbation: Resolved with IV diuresis -Continue oral furosemide 40 mill grams daily -Liberalize diet to regular diet  #) Recurrent UTIs: UA here is negative  #) Paroxysmal atrial fibrillation: Family would like to continue her anticoagulation for now. -Continue apixaban 5  mg twice daily -Continue to hold diltiazem 30 mg every 8 hours  #) Gout: -Continue allopurinol 100 mg daily  #) Hyperlipidemia: -Continue atorvastatin 80 mg daily  #) Coronary artery disease status post multiple stents: - Continue atorvastatin -Apixaban for anticoagulation  #)  type 2 diabetes: -Continue sliding scale insulin here however will check blood sugars only once a day  Fluids: Restrict Electrolytes: Monitor and support Nutrition: Sodium restricted fluid restricted diet  Prophylaxis: Apixaban  Disposition: Pending bed [facility]  DO NOT RESUSCITATE  Consultants:   Psychiatry  Palliative care  Procedures: (Don't include imaging studies which can be auto populated. Include things that cannot be auto populated i.e. Echo, Carotid and venous dopplers, Foley, Bipap, HD, tubes/drains, wound vac, central lines etc) Echo 10/03/2017: - Left ventricle: LVEF is severely depressed at approximately 20%   The cavity size was mildly dilated. Wall thickness was increased   in a pattern of mild LVH. - Mitral valve: There was mild regurgitation. - Right ventricle: The cavity size was mildly dilated. - Right atrium: The atrium was mildly dilated.  - Pulmonary arteries: PA peak pressure: 45 mm Hg (S).  Antimicrobials: (specify start and planned stop date. Auto populated tables are space occupying and do not give end dates)  IV ceftriaxone 3/19 through 10/06/2017   Subjective: Patient cannot provide any history.  Her son is at bedside today and reports that she is more comfortable.  Objective: Vitals:   10/07/17 2241 10/08/17 0117 10/08/17 0622 10/08/17 1309  BP: (!) 88/66 99/65 92/68  112/61  Pulse:   (!) 118 (!) 109  Resp:   16 17  Temp:   98.5 F (  36.9 C) 98.6 F (37 C)  TempSrc:   Axillary Oral  SpO2:   100% 100%  Weight:      Height:        Intake/Output Summary (Last 24 hours) at 10/08/2017 1420 Last data filed at 10/08/2017  1309 Gross per 24 hour  Intake 0 ml  Output -  Net 0 ml   Filed Weights   10/01/17 1710 10/01/17 1718 10/01/17 2256  Weight: 90.7 kg (200 lb) 90.7 kg (200 lb) 84.7 kg (186 lb 11.7 oz)    Examination:  General exam: Sleeping in bed Respiratory system: Clear to auscultation.  No increased work of breathing Cardiovascular system: Distant heart sounds, regular rate and rhythm, no murmurs Gastrointestinal system: Soft, nondistended, nontender, plus bowel sounds Central nervous system: Patient is not alert or oriented.  She has no focal neurological deficits on exam.  She cannot participate with a complete CNS exam Extremities: No lower extremity edema Skin: No rashes on visible skin Psychiatry: Unable to participate due to patient condition    Data Reviewed: I have personally reviewed following labs and imaging studies  CBC: Recent Labs  Lab 10/01/17 1739 10/04/17 0754 10/06/17 0515  WBC 11.1* 8.6 8.4  NEUTROABS 8.7* 5.3 5.1  HGB 12.7 14.1 11.9*  HCT 41.8 43.6 37.7  MCV 86.9 83.5 84.3  PLT 202 134* 134*   Basic Metabolic Panel: Recent Labs  Lab 10/01/17 1739 10/04/17 0754 10/04/17 2350 10/05/17 0405 10/06/17 0515 10/06/17 1351 10/07/17 0627  NA 142 141  --  141 140  --  140  K 4.8 2.6* 3.1* 4.1 2.5* 3.6 4.1  CL 104 95*  --  97* 95*  --  95*  CO2 26 34*  --  32 32  --  28  GLUCOSE 136* 85  --  103* 103*  --  164*  BUN 11 6  --  7 7  --  8  CREATININE 0.74 0.60  --  0.65 0.63  --  0.75  CALCIUM 8.8* 8.1*  --  8.0* 7.7*  --  8.4*   GFR: Estimated Creatinine Clearance: 63.2 mL/min (by C-G formula based on SCr of 0.75 mg/dL). Liver Function Tests: Recent Labs  Lab 10/01/17 1909  AST 46*  ALT 14  ALKPHOS 144*  BILITOT 1.2  PROT 6.3*  ALBUMIN 2.5*   No results for input(s): LIPASE, AMYLASE in the last 168 hours. No results for input(s): AMMONIA in the last 168 hours. Coagulation Profile: No results for input(s): INR, PROTIME in the last 168  hours. Cardiac Enzymes: Recent Labs  Lab 10/01/17 1739 10/02/17 0009 10/02/17 1443 10/04/17 2350  TROPONINI 0.04* 0.04* 0.07*  0.07* 0.07*   BNP (last 3 results) No results for input(s): PROBNP in the last 8760 hours. HbA1C: No results for input(s): HGBA1C in the last 72 hours. CBG: Recent Labs  Lab 10/06/17 2037 10/06/17 2337 10/07/17 0736 10/07/17 1147 10/08/17 0744  GLUCAP 133* 154* 150* 204* 154*   Lipid Profile: No results for input(s): CHOL, HDL, LDLCALC, TRIG, CHOLHDL, LDLDIRECT in the last 72 hours. Thyroid Function Tests: No results for input(s): TSH, T4TOTAL, FREET4, T3FREE, THYROIDAB in the last 72 hours. Anemia Panel: No results for input(s): VITAMINB12, FOLATE, FERRITIN, TIBC, IRON, RETICCTPCT in the last 72 hours. Sepsis Labs: No results for input(s): PROCALCITON, LATICACIDVEN in the last 168 hours.  Recent Results (from the past 240 hour(s))  Urine culture     Status: None   Collection Time: 10/01/17  5:23 PM  Result Value Ref Range Status   Specimen Description   Final    URINE, RANDOM Performed at [facility], [address], [place], [place] [address]    Special Requests   Final    NONE Performed at [facility], [address], [place], [place] [address]    Culture   Final    NO GROWTH Performed at [facility], [address] [place], [place] [address]    Report Status 10/03/2017 FINAL  Final         Radiology Studies: Ct Head Wo Contrast  Result Date: 10/07/2017 CLINICAL DATA:  Fall with head trauma and unequal pupils. EXAM: CT HEAD WITHOUT CONTRAST TECHNIQUE: Contiguous axial images were obtained from the base of the skull through the vertex without intravenous contrast. COMPARISON:  Head CT 09/28/2016 FINDINGS: Brain: No mass lesion, intraparenchymal hemorrhage or extra-axial collection. No evidence of acute cortical infarct. Periventricular hypoattenuation suggesting chronic microvascular  disease. Vascular: No hyperdense vessel or unexpected vascular calcification. Skull: Normal visualized skull base, calvarium and extracranial soft tissues. Sinuses/Orbits: No sinus fluid levels or advanced mucosal thickening. No mastoid effusion. Normal orbits. IMPRESSION: Chronic small vessel disease without acute intracranial abnormality. Electronically Signed   By: Deatra Robinson M.D.   On: 10/07/2017 01:47   Dg Hip Unilat With Pelvis Min 4 Views Left  Result Date: 10/07/2017 CLINICAL DATA:  Leg pain EXAM: DG HIP (WITH OR WITHOUT PELVIS) 4+V LEFT COMPARISON:  None. FINDINGS: There is no evidence of hip fracture or dislocation. There is no evidence of arthropathy or other focal bone abnormality. IMPRESSION: No acute osseous injury of the left hip. Electronically Signed   By: Elige Ko   On: 10/07/2017 15:56        Scheduled Meds: . allopurinol  100 mg Oral Daily  . apixaban  5 mg Oral BID  . atorvastatin  80 mg Oral q1800  . docusate sodium  100 mg Oral BID  . dorzolamide  1 drop Both Eyes TID  . furosemide  40 mg Oral Daily  . Gerhardt's butt cream   Topical QID  . mouth rinse  15 mL Mouth Rinse BID  . Melatonin  1 mg Oral QHS  . nutrition supplement (JUVEN)  1 packet Oral BID BM  . OLANZapine  2.5 mg Oral Daily  . OLANZapine  5 mg Oral QHS  . pantoprazole  40 mg Oral Daily  . sodium chloride flush  3 mL Intravenous Q12H   Continuous Infusions: . sodium chloride       LOS: 7 days    Time spent: 30    Delaine Lame, MD [facility]  If 7PM-7AM, please contact night-coverage [email or web address] Password [email or web address] 10/08/2017, 2:20 PM

## 2017-10-09 DIAGNOSIS — L89151 Pressure ulcer of sacral region, stage 1: Secondary | ICD-10-CM

## 2017-10-09 DIAGNOSIS — I480 Paroxysmal atrial fibrillation: Secondary | ICD-10-CM

## 2017-10-09 DIAGNOSIS — I10 Essential (primary) hypertension: Secondary | ICD-10-CM

## 2017-10-09 LAB — GLUCOSE, CAPILLARY: GLUCOSE-CAPILLARY: 212 mg/dL — AB (ref 65–99)

## 2017-10-09 MED ORDER — ONDANSETRON HCL 4 MG PO TABS: 4.0000 mg | ORAL_TABLET | Freq: Four times a day (QID) | ORAL | 0 refills | Status: AC | PRN

## 2017-10-09 MED ORDER — OLANZAPINE 5 MG PO TABS: 5.0000 mg | ORAL_TABLET | Freq: Every day | ORAL | Status: AC

## 2017-10-09 MED ORDER — OXYCODONE HCL 5 MG PO TABS: 5.0000 mg | ORAL_TABLET | ORAL | 0 refills | Status: AC | PRN

## 2017-10-09 MED ORDER — OLANZAPINE 2.5 MG PO TABS: 2.5000 mg | ORAL_TABLET | Freq: Every day | ORAL | Status: AC

## 2017-10-09 MED ORDER — DOCUSATE SODIUM 100 MG PO CAPS: 100.0000 mg | ORAL_CAPSULE | Freq: Two times a day (BID) | ORAL | 0 refills | Status: AC

## 2017-10-09 MED ORDER — FUROSEMIDE 40 MG PO TABS: 40.0000 mg | ORAL_TABLET | Freq: Every day | ORAL | Status: AC

## 2017-10-09 MED ORDER — HALOPERIDOL LACTATE 5 MG/ML IJ SOLN: 0.5000 mg | INTRAMUSCULAR | Status: AC | PRN

## 2017-10-09 NOTE — Progress Notes (Signed)
Nutrition Brief Note  Chart reviewed. Pt now awaiting placement to residential hospice.  No further nutrition interventions warranted at this time.  Please re-consult as needed.   Vanessa Kickarly Clara Herbison RD, LDN Clinical Nutrition Pager # [phone]- [phone]

## 2017-10-09 NOTE — Progress Notes (Signed)
Physical Therapy Discharge Patient Details Name: Stacy ShorterJanice Dorsey MRN: [id number] DOB: 1945/08/18 Today's Date: 10/09/2017 Time:  -     Patient discharged from PT services secondary to medical decline - will need to re-order PT to resume therapy services.  Please see latest therapy progress note for current level of functioning and progress toward goals.      Stacy Dorsey PT [phone] GP     Rada HayHill, Stacy Dorsey Elizabeth 10/09/2017, 7:19 AM

## 2017-10-09 NOTE — Discharge Summary (Addendum)
Physician Discharge Summary  Stacy Dorsey ZOX:[id number] DOB: 02-08-1946 DOA: 10/01/2017  PCP: Patient, No Pcp Per  Admit date: 10/01/2017 Discharge date: 10/09/2017  Admitted From: Home Disposition:  Residential Hospice  Recommendations for Outpatient Follow-up:  Follow up with Hospice service  CODE STATUS:DNR/hospice Diet recommendation: as tolerated  Brief/Interim Summary: Please see admit h and p for details. Briefly, 72 year old with past medical history relevant for systolic heart failure EF of 20% by echo on 10/03/2017, hypertension, paroxysmal atrial fibrillation on apixaban, gout, hyperlipidemia, type 2 diabetes, coronary artery disease status post multiple stents, prior CVA, dementia admitted with acute systolic congestive heart failure exacerbation.  Her course has been complicated by chronic delirium for which apparently has been ongoing for almost now 6 months.  Her family has transitioned her to hospice  #) dementia/agitation: Patient had continued to be persistently agitated and delirious.  She is also having some pain in her left hip however x-ray if this is not shown any acute process. -Psychiatry was consulted this admit -continued olanzapine 2.5 mg every morning and 5 mg nightly -Continue melatonin 1 mg nightly  -Palliative care was consulted, appreciate recommendations -Continue PRN Haldol for breakthrough agitation per palliative care -Continue analgesics as tolerated  #) Acute systolic congestive heart failure exacerbation: Resolved with IV diuresis -Continue oral furosemide 40 mill grams daily -Liberalize diet to regular diet as tolerated  #) Recurrent UTIs: UA here is negative  #) Paroxysmal atrial fibrillation: Family would like to continue her anticoagulation for now. -Continued apixaban 5 mg twice daily, would hold given palliative care status -Continue to hold diltiazem 30 mg every 8 hours  #) Gout: -Continued allopurinol 100 mg daily -Stable at  present  #) Hyperlipidemia: -Continued atorvastatin 80 mg daily -Would hold on discharge given hospice status  #) Coronary artery disease status post multiple stents: - Continued atorvastatin, hold per above -Apixaban was given for anticoagulation  #)  type 2 diabetes: -remained stable  Toxic-metabolic encephalopathy   Discharge Diagnoses:  Principal Problem:   Delirium due to another medical condition Active Problems:   Acute lower UTI   CAD (coronary artery disease)   CHF exacerbation (HCC)   Essential hypertension   Paroxysmal A-fib (HCC): Hx of per hospitalization of 08/02/2017--[facility]   CHF (congestive heart failure), NYHA class III, acute on chronic, combined (HCC)   Pressure injury of coccygeal region, stage 1    Discharge Instructions   Allergies as of 10/09/2017      Reactions   Allegra [fexofenadine] Anaphylaxis   Carvedilol Anaphylaxis   Sulfamethoxazole-trimethoprim Anaphylaxis   Sulfanilamide Anaphylaxis   Adhesive [tape]    Causes Blisters.   Ceftriaxone Diarrhea   Cephalexin Hives   Clindamycin/lincomycin Hives   Clopidogrel    Ezetimibe Nausea Only   Haloperidol And Related Other (See Comments)   Confusion   Hydrocodone Hives   Insulin Degludec    Heart Flutters   Insulin Glargine    Headache   Isosorbide Mononitrate [isosorbide Dinitrate Er]    Excessive sleep   Moxifloxacin    Other    Phenylpropanolamin-Hydrocodone   Prasugrel    Bleeding   Ramipril    Rosuvastatin Hives   Causes her heart to race and pounding in the chest.   Tylenol [acetaminophen]    Atorvastatin Calcium Palpitations   Minocycline Rash      Medication List    STOP taking these medications   allopurinol 100 MG tablet Commonly known as:  ZYLOPRIM   atorvastatin 80 MG tablet  Commonly known as:  LIPITOR   ELIQUIS 5 MG Tabs tablet Generic drug:  apixaban   NOVOLOG 100 UNIT/ML injection Generic drug:  insulin aspart   omeprazole 20 MG  capsule Commonly known as:  PRILOSEC   QUEtiapine 25 MG tablet Commonly known as:  SEROQUEL   RESOURCE THICKENUP CLEAR Powd     TAKE these medications   diltiazem 30 MG tablet Commonly known as:  CARDIZEM Take 1 tablet (30 mg total) by mouth every 8 (eight) hours.   docusate sodium 100 MG capsule Commonly known as:  COLACE Take 1 capsule (100 mg total) by mouth 2 (two) times daily.   dorzolamide 2 % ophthalmic solution Commonly known as:  TRUSOPT Place 1 drop into both eyes 3 (three) times daily.   furosemide 40 MG tablet Commonly known as:  LASIX Take 1 tablet (40 mg total) by mouth daily.   haloperidol lactate 5 MG/ML injection Commonly known as:  HALDOL Inject 0.1 mLs (0.5 mg total) into the vein every 2 (two) hours as needed.   MELATONIN PO Take 1 tablet by mouth at bedtime.   nitroGLYCERIN 0.4 MG SL tablet Commonly known as:  NITROSTAT Place 0.4 mg under the tongue every 5 (five) minutes as needed for chest pain.   OLANZapine 5 MG tablet Commonly known as:  ZYPREXA Take 1 tablet (5 mg total) by mouth at bedtime. What changed:  when to take this   OLANZapine 2.5 MG tablet Commonly known as:  ZYPREXA Take 1 tablet (2.5 mg total) by mouth daily. What changed:  You were already taking a medication with the same name, and this prescription was added. Make sure you understand how and when to take each.   ondansetron 4 MG tablet Commonly known as:  ZOFRAN Take 1 tablet (4 mg total) by mouth every 6 (six) hours as needed for nausea.   oxyCODONE 5 MG immediate release tablet Commonly known as:  Oxy IR/ROXICODONE Take 1 tablet (5 mg total) by mouth every 4 (four) hours as needed for moderate pain.      Follow-up Information    Follow up with Hospice Follow up.          Allergies  Allergen Reactions  . Allegra [Fexofenadine] Anaphylaxis  . Carvedilol Anaphylaxis  . Sulfamethoxazole-Trimethoprim Anaphylaxis  . Sulfanilamide Anaphylaxis  . Adhesive [Tape]      Causes Blisters.  . Ceftriaxone Diarrhea  . Cephalexin Hives  . Clindamycin/Lincomycin Hives  . Clopidogrel   . Ezetimibe Nausea Only  . Haloperidol And Related Other (See Comments)    Confusion  . Hydrocodone Hives  . Insulin Degludec     Heart Flutters  . Insulin Glargine     Headache  . Isosorbide Mononitrate [Isosorbide Dinitrate Er]     Excessive sleep  . Moxifloxacin   . Other     Phenylpropanolamin-Hydrocodone  . Prasugrel     Bleeding  . Ramipril   . Rosuvastatin Hives    Causes her heart to race and pounding in the chest.  . Tylenol [Acetaminophen]   . Atorvastatin Calcium Palpitations  . Minocycline Rash    Consultations:  Psychiatrty  Palliative Care  Procedures/Studies: Ct Abdomen Pelvis Wo Contrast  Result Date: 09/18/2017 CLINICAL DATA:  Abdomen distension with nausea and vomiting EXAM: CT ABDOMEN AND PELVIS WITHOUT CONTRAST TECHNIQUE: Multidetector CT imaging of the abdomen and pelvis was performed following the standard protocol without IV contrast. COMPARISON:  Ultrasound 09/17/2017, report 03/17/2013 FINDINGS: Lower chest: Small left greater than right  pleural effusion. Cardiomegaly with coronary vascular calcification. Small moderate hiatal hernia with surgical clips near the GE junction. Hepatobiliary: No focal liver abnormality is seen. Status post cholecystectomy. No biliary dilatation. Pancreas: Unremarkable. No pancreatic ductal dilatation or surrounding inflammatory changes. Spleen: Normal in size without focal abnormality. Adrenals/Urinary Tract: Adrenal glands are within normal limits. Kidneys show no hydronephrosis. The bladder is under distended Stomach/Bowel: Stomach is within normal limits. Appendix appears normal. No evidence of bowel wall thickening, distention, or inflammatory changes. Vascular/Lymphatic: Moderate aortic atherosclerosis. No aneurysmal dilatation. No significantly enlarged lymph nodes. Reproductive: Status post hysterectomy. No  adnexal masses. Other: Negative for free air or significant free fluid. Ventral hernia containing mesenteric fat within the upper abdominal wall. Supraumbilical hernia containing fat with abdominal wall laxity and protrusion of small bowel loops and mesenteric fat anteriorly. Diffuse subcutaneous edema with fluid in the left greater than right flank regions. Musculoskeletal: Degenerative changes. No acute or suspicious lesion. IMPRESSION: 1. Negative for a bowel obstruction or bowel wall thickening. 2. Small left greater than right pleural effusions with bibasilar atelectasis or minimal infiltrate. 3. Fat containing ventral hernias. 4. Diffuse subcutaneous edema with fluid in the left greater than right flank. Electronically Signed   By: Jasmine Pang M.D.   On: 09/18/2017 03:02   Dg Chest 2 View  Result Date: 10/01/2017 CLINICAL DATA:  Fluid retention EXAM: CHEST - 2 VIEW COMPARISON:  09/20/2017, 09/17/2016, 08/08/2017 FINDINGS: Small bilateral pleural effusions. Cardiomegaly with vascular congestion and bilateral perihilar edema. Consolidations at both lung bases. No pneumothorax. IMPRESSION: 1. Small bilateral pleural effusions, not significantly changed 2. Cardiomegaly with vascular congestion and perihilar edema. Bibasilar atelectasis or pneumonia. Electronically Signed   By: Jasmine Pang M.D.   On: 10/01/2017 21:00   Ct Head Wo Contrast  Result Date: 10/07/2017 CLINICAL DATA:  Fall with head trauma and unequal pupils. EXAM: CT HEAD WITHOUT CONTRAST TECHNIQUE: Contiguous axial images were obtained from the base of the skull through the vertex without intravenous contrast. COMPARISON:  Head CT 09/28/2016 FINDINGS: Brain: No mass lesion, intraparenchymal hemorrhage or extra-axial collection. No evidence of acute cortical infarct. Periventricular hypoattenuation suggesting chronic microvascular disease. Vascular: No hyperdense vessel or unexpected vascular calcification. Skull: Normal visualized skull  base, calvarium and extracranial soft tissues. Sinuses/Orbits: No sinus fluid levels or advanced mucosal thickening. No mastoid effusion. Normal orbits. IMPRESSION: Chronic small vessel disease without acute intracranial abnormality. Electronically Signed   By: Deatra Robinson M.D.   On: 10/07/2017 01:47   Ct Head Wo Contrast  Result Date: 09/18/2017 CLINICAL DATA:  Altered level of consciousness, delirium with agitation EXAM: CT HEAD WITHOUT CONTRAST TECHNIQUE: Contiguous axial images were obtained from the base of the skull through the vertex without intravenous contrast. COMPARISON:  08/03/2017 head CT, MRI 06/07/2017 FINDINGS: Brain: No acute territorial infarction, hemorrhage or intracranial mass is visualized. Atrophy and mild small vessel ischemic changes of the white matter. Stable ventricle size. Vascular: No hyperdense vessels.  Carotid vascular calcification Skull: Normal. Negative for fracture or focal lesion. Sinuses/Orbits: Mild mucosal thickening in the maxillary and ethmoid sinuses. No acute orbital abnormality. Other: None IMPRESSION: 1. No CT evidence for acute intracranial abnormality. 2. Atrophy and small vessel ischemic changes of the white matter. Electronically Signed   By: Jasmine Pang M.D.   On: 09/18/2017 02:52   Dg Chest Port 1 View  Result Date: 09/20/2017 CLINICAL DATA:  Encephalopathy EXAM: PORTABLE CHEST 1 VIEW COMPARISON:  09/17/2017 FINDINGS: Progression of bilateral airspace disease with perihilar component suggesting  heart failure. Progression of bibasilar atelectasis and small effusions. Cardiac enlargement. Left coronary stents. IMPRESSION: Progressive heart failure with edema. Progressive bibasilar atelectasis and small effusions. Electronically Signed   By: Marlan Palau M.D.   On: 09/20/2017 09:59   Dg Chest Port 1 View  Result Date: 09/17/2017 CLINICAL DATA:  Altered mental status.  Sepsis. EXAM: PORTABLE CHEST 1 VIEW COMPARISON:  08/08/2017 FINDINGS: Patient rotated  left. Artifact projects over the medial upper right lung. Cardiomegaly accentuated by AP portable technique. Probable small bilateral pleural effusions. No pneumothorax. Persistent bibasilar airspace disease. Low lung volumes with resultant pulmonary interstitial prominence. IMPRESSION: Probable small bilateral pleural effusions. Bibasilar airspace disease could represent persistent or recurrent atelectasis versus infection. Cardiomegaly and low lung volumes.  No overt congestive failure. Electronically Signed   By: Jeronimo Greaves M.D.   On: 09/17/2017 20:43   Dg Hip Unilat With Pelvis Min 4 Views Left  Result Date: 10/07/2017 CLINICAL DATA:  Leg pain EXAM: DG HIP (WITH OR WITHOUT PELVIS) 4+V LEFT COMPARISON:  None. FINDINGS: There is no evidence of hip fracture or dislocation. There is no evidence of arthropathy or other focal bone abnormality. IMPRESSION: No acute osseous injury of the left hip. Electronically Signed   By: Elige Ko   On: 10/07/2017 15:56   US Abdomen Limited Ruq  Result Date: 09/17/2017 CLINICAL DATA:  Right upper quadrant pain. EXAM: ULTRASOUND ABDOMEN LIMITED RIGHT UPPER QUADRANT COMPARISON:  None. FINDINGS: Gallbladder: Surgically absent. Common bile duct: Diameter: 6 mm. Liver: Technically limited exam due to patient motion and inability to hold position. No focal lesion identified. Grossly within normal limits in parenchymal echogenicity. Portal vein is patent on color Doppler imaging with normal direction of blood flow towards the liver. IMPRESSION: 1. Postcholecystectomy without biliary dilatation. 2. No gross focal hepatic abnormality demonstrated sonographically. Electronically Signed   By: Rubye Oaks M.D.   On: 09/17/2017 23:44    Subjective: Confused, but without complaints  Discharge Exam: Vitals:   10/08/17 2137 10/09/17 0510  BP: 103/85 97/64  Pulse: (!) 116 (!) 120  Resp: 16 20  Temp: 98.5 F (36.9 C) 98.1 F (36.7 C)  SpO2: 100% 95%   Vitals:    10/08/17 0622 10/08/17 1309 10/08/17 2137 10/09/17 0510  BP: 92/68 112/61 103/85 97/64  Pulse: (!) 118 (!) 109 (!) 116 (!) 120  Resp: 16 17 16 20   Temp: 98.5 F (36.9 C) 98.6 F (37 C) 98.5 F (36.9 C) 98.1 F (36.7 C)  TempSrc: Axillary Oral Axillary Oral  SpO2: 100% 100% 100% 95%  Weight:      Height:        General: Pt is alert, awake, not in acute distress Cardiovascular: RRR, S1/S2 +, no rubs, no gallops Respiratory: CTA bilaterally, no wheezing, no rhonchi Abdominal: Soft, NT, ND, bowel sounds + Extremities: no edema, no cyanosis   The results of significant diagnostics from this hospitalization (including imaging, microbiology, ancillary and laboratory) are listed below for reference.     Microbiology: Recent Results (from the past 240 hour(s))  Urine culture     Status: None   Collection Time: 10/01/17  5:23 PM  Result Value Ref Range Status   Specimen Description   Final    URINE, RANDOM Performed at [facility], [address], [place], [place] [address]    Special Requests   Final    NONE Performed at [facility], [address] [place], [place] [address]    Culture  Final    NO GROWTH Performed at [facility], [address] [place], [place] [address]    Report Status 10/03/2017 FINAL  Final     Labs: BNP (last 3 results) Recent Labs    10/01/17 1739  BNP 729.1*   Basic Metabolic Panel: Recent Labs  Lab 10/04/17 0754 10/04/17 2350 10/05/17 0405 10/06/17 0515 10/06/17 1351 10/07/17 0627  NA 141  --  141 140  --  140  K 2.6* 3.1* 4.1 2.5* 3.6 4.1  CL 95*  --  97* 95*  --  95*  CO2 34*  --  32 32  --  28  GLUCOSE 85  --  103* 103*  --  164*  BUN 6  --  7 7  --  8  CREATININE 0.60  --  0.65 0.63  --  0.75  CALCIUM 8.1*  --  8.0* 7.7*  --  8.4*   Liver Function Tests: No results for input(s): AST, ALT, ALKPHOS, BILITOT, PROT, ALBUMIN in the last 168 hours. No results for input(s): LIPASE,  AMYLASE in the last 168 hours. No results for input(s): AMMONIA in the last 168 hours. CBC: Recent Labs  Lab 10/04/17 0754 10/06/17 0515  WBC 8.6 8.4  NEUTROABS 5.3 5.1  HGB 14.1 11.9*  HCT 43.6 37.7  MCV 83.5 84.3  PLT 134* 134*   Cardiac Enzymes: Recent Labs  Lab 10/02/17 1443 10/04/17 2350  TROPONINI 0.07*  0.07* 0.07*   BNP: Invalid input(s): POCBNP CBG: Recent Labs  Lab 10/06/17 2337 10/07/17 0736 10/07/17 1147 10/08/17 0744 10/09/17 0751  GLUCAP 154* 150* 204* 154* 212*   D-Dimer No results for input(s): DDIMER in the last 72 hours. Hgb A1c No results for input(s): HGBA1C in the last 72 hours. Lipid Profile No results for input(s): CHOL, HDL, LDLCALC, TRIG, CHOLHDL, LDLDIRECT in the last 72 hours. Thyroid function studies No results for input(s): TSH, T4TOTAL, T3FREE, THYROIDAB in the last 72 hours.  Invalid input(s): FREET3 Anemia work up No results for input(s): VITAMINB12, FOLATE, FERRITIN, TIBC, IRON, RETICCTPCT in the last 72 hours. Urinalysis    Component Value Date/Time   COLORURINE AMBER (A) 10/01/2017 1723   APPEARANCEUR CLOUDY (A) 10/01/2017 1723   LABSPEC 1.025 10/01/2017 1723   PHURINE 5.0 10/01/2017 1723   GLUCOSEU NEGATIVE 10/01/2017 1723   HGBUR NEGATIVE 10/01/2017 1723   BILIRUBINUR SMALL (A) 10/01/2017 1723   KETONESUR 5 (A) 10/01/2017 1723   PROTEINUR 100 (A) 10/01/2017 1723   NITRITE NEGATIVE 10/01/2017 1723   LEUKOCYTESUR MODERATE (A) 10/01/2017 1723   Sepsis Labs Invalid input(s): PROCALCITONIN,  WBC,  LACTICIDVEN Microbiology Recent Results (from the past 240 hour(s))  Urine culture     Status: None   Collection Time: 10/01/17  5:23 PM  Result Value Ref Range Status   Specimen Description   Final    URINE, RANDOM Performed at [facility], [address], [place], [place] [address]    Special Requests   Final    NONE Performed at [facility], [address] [place],  [place] [address]    Culture   Final    NO GROWTH Performed at [facility], [address] [place], [place] [address]    Report Status 10/03/2017 FINAL  Final     SIGNED:   Rickey Barbara, MD  [facility] 10/09/2017, 11:55 AM  If 7PM-7AM, please contact night-coverage [email or web address] Password [email or web address]

## 2017-10-09 NOTE — Progress Notes (Signed)
   10/09/17 1447  Clinical Encounter Type  Visited With Patient and family together  Visit Type Follow-up  Spiritual Encounters  Spiritual Needs Emotional;Prayer   Following up from previous visit.  Family indicated the patient will move to Hospice today.  Patient was awake and family at bedside.  Family stated how hard this is to be with their loved one, but they wouldn't be anywhere else.  We prayed together.  Patient said thank you.  Will follow and support as needed. Chaplain Agustin CreeNewton Johan Creveling

## 2017-10-09 NOTE — Progress Notes (Signed)
[facility] discharging [facility].   CSW informed by [facility] liaison that patient's paperwork was completed.  CSW faxed discharge summary to [facility].  [facility] contacted, patient's family notified [facility] administrator([name]). Patient's RN can call report to [phone], packet complete. CSW signing off, no other needs identified at this time.  Celso SickleKimberly Shelby Peltz, [place] Clinical Social Worker [facility] Cell#: [phone]

## 2017-10-09 NOTE — Progress Notes (Signed)
Daily Progress Note   Patient Name: Stacy Dorsey       Date: 10/09/2017 DOB: 02-29-1946  Age: 72 y.o. MRN#: [id number] Attending Physician: Jerald Kief, MD Primary Care Physician: Patient, No Pcp Per Admit Date: 10/01/2017  Reason for Consultation/Follow-up: Hospice Evaluation  Subjective: Daughters at bedside.  Patient arousable, but begins to moan and cry when wakened.  Discussed plan for residential hospice with family.    See below.  Length of Stay: 8  Current Medications: Scheduled Meds:  . allopurinol  100 mg Oral Daily  . apixaban  5 mg Oral BID  . atorvastatin  80 mg Oral q1800  . docusate sodium  100 mg Oral BID  . dorzolamide  1 drop Both Eyes TID  . furosemide  40 mg Oral Daily  . Gerhardt's butt cream   Topical QID  . mouth rinse  15 mL Mouth Rinse BID  . Melatonin  1 mg Oral QHS  . nutrition supplement (JUVEN)  1 packet Oral BID BM  . OLANZapine  2.5 mg Oral Daily  . OLANZapine  5 mg Oral QHS  . pantoprazole  40 mg Oral Daily  . sodium chloride flush  3 mL Intravenous Q12H    Continuous Infusions: . sodium chloride      PRN Meds: sodium chloride, fentaNYL (SUBLIMAZE) injection, haloperidol lactate, naLOXone (NARCAN)  injection, nitroGLYCERIN, OLANZapine, ondansetron **OR** ondansetron (ZOFRAN) IV, oxyCODONE, RESOURCE THICKENUP CLEAR, sodium chloride flush  Physical Exam         Sleeping, moans when awakened Has been rest less and agitated at times Mumbles incoherently  S1 S2 Regular Abdomen distended No edema   Vital Signs: BP 97/64 (BP Location: Right Arm)   Pulse (!) 120   Temp 98.1 F (36.7 C) (Oral)   Resp 20   Ht 5' (1.524 m)   Wt 84.7 kg (186 lb 11.7 oz)   SpO2 95%   BMI 36.47 kg/m  SpO2: SpO2: 95 % O2 Device: O2 Device: Room  Air O2 Flow Rate: O2 Flow Rate (L/min): 3.5 L/min  Intake/output summary:   Intake/Output Summary (Last 24 hours) at 10/09/2017 1220 Last data filed at 10/08/2017 1800 Gross per 24 hour  Intake 70 ml  Output -  Net 70 ml   LBM: Last BM Date: 10/07/17 Baseline Weight: Weight: 90.7 kg (  200 lb) Most recent weight: Weight: 84.7 kg (186 lb 11.7 oz)       Palliative Assessment/Data:    Flowsheet Rows     Most Recent Value  Intake Tab  Referral Department  Hospitalist  Unit at Time of Referral  Med/Surg Unit  Palliative Care Primary Diagnosis  Cardiac  Date Notified  10/05/17  Palliative Care Type  New Palliative care  Reason for referral  Clarify Goals of Care, Counsel Regarding Hospice  Date of Admission  10/01/17  Date first seen by Palliative Care  10/06/17  # of days Palliative referral response time  1 Day(s)  # of days IP prior to Palliative referral  4  Clinical Assessment  Psychosocial & Spiritual Assessment  Palliative Care Outcomes      Patient Active Problem List   Diagnosis Date Noted  . Pressure injury of coccygeal region, stage 1 10/05/2017  . Delirium due to another medical condition 10/03/2017  . CHF (congestive heart failure), NYHA class III, acute on chronic, combined (HCC) 10/01/2017  . Paroxysmal A-fib (HCC): Hx of per hospitalization of 08/02/[facility]--[facility] 09/21/2017  . CVA (cerebral vascular accident) (HCC): Hx of CVA 09/21/2017  . Pressure injury of skin 09/19/2017  . Acute cystitis without hematuria   . Acute encephalopathy 09/18/2017  . Acute lower UTI 09/18/2017  . CAD (coronary artery disease) 09/18/2017  . CHF exacerbation (HCC) 09/18/2017  . Essential hypertension 09/18/2017  . Sepsis (HCC) 09/17/2017    Palliative Care Assessment & Plan   Patient Profile:    Assessment:  Systolic HF EF 20% on recent ECHO PAF HTN DM HLD CAD Dementia, history of CVA Recent UTI Functional and cognitive decline since the past few  months.  PPS 20%  Recommendations/Plan:  Pain: Appears improved on exam.  Continue current regimen.  Agitation: Continue haldol as needed.  Symptoms improving, but would still benefit from residential hospice setting for continued aggressive symptom management.     Code Status:    Code Status Orders  (From admission, onward)        Start     Ordered   10/02/17 0019  Do not attempt resuscitation (DNR)  Continuous    Question Answer Comment  In the event of cardiac or respiratory ARREST Do not call a "code blue"   In the event of cardiac or respiratory ARREST Do not perform Intubation, CPR, defibrillation or ACLS   In the event of cardiac or respiratory ARREST Use medication by any route, position, wound care, and other measures to relive pain and suffering. May use oxygen, suction and manual treatment of airway obstruction as needed for comfort.      10/02/17 0018     Prognosis:   < 2 weeks  Discharge Planning:  Hospice facility  Care plan was discussed with  Patient's RN.  Thank you for allowing the Palliative Medicine Team to assist in the care of this patient.  Total time: 25 minutes    Greater than 50%  of this time was spent counseling and coordinating care related to the above assessment and plan.  Romie MinusGene Erinn Mendosa, MD [facility] Palliative Medicine Team [phone]  Please contact Palliative Medicine Team phone at [phone] for questions and concerns.

## 2017-10-09 NOTE — Progress Notes (Signed)
CSW informed by [facility] liaison that [facility]. Hospital Liaison agreed to contact patient's daughter and provide CSW with an update. CSW will continue to follow and assist with discharge planning.  Celso SickleKimberly Kierston Plasencia, [place] Clinical Social Worker [facility] Cell#: [phone]

## 2017-11-13 DEATH — deceased

## 2018-07-08 IMAGING — CT CT HEAD W/O CM
4 of 7 series · 14 of 47 positions shown, 16 images · non-contrast
Comparison: Head CT 09/28/2016

CLINICAL DATA: Fall with head trauma and unequal pupils.

EXAM:
CT HEAD WITHOUT CONTRAST
TECHNIQUE: Contiguous axial images were obtained from the base of the skull
through the vertex without intravenous contrast.

[Series 2: head wo · axial · 0.47mm/px · z∈[-112,-7]mm · 7 of 29 slices shown, 9 images (1 of 2)]
[im 4/29  brain]
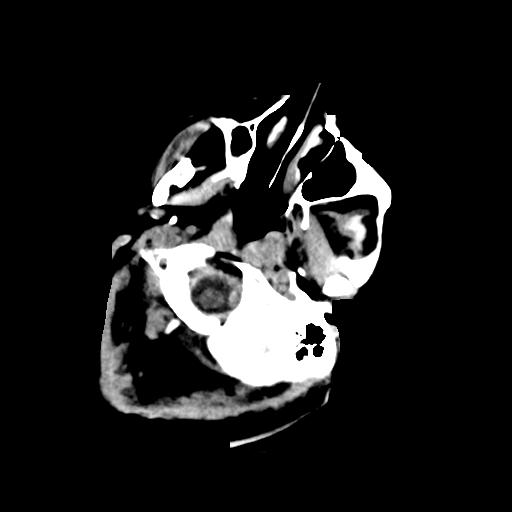
[im 4/29  bone]
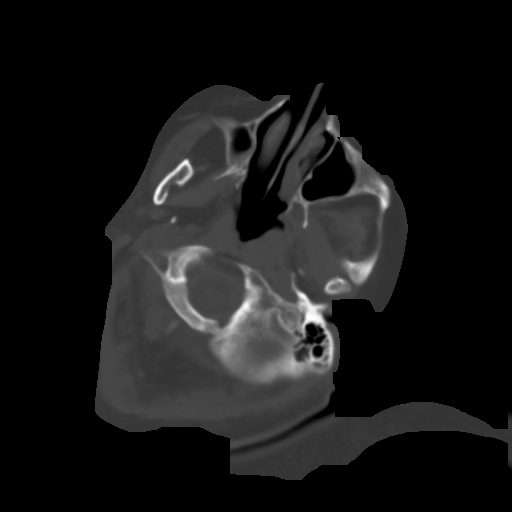
[im 8/29  brain]
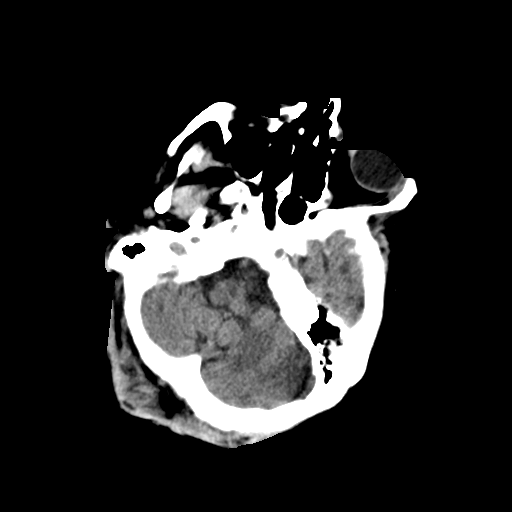
[im 11/29  brain]
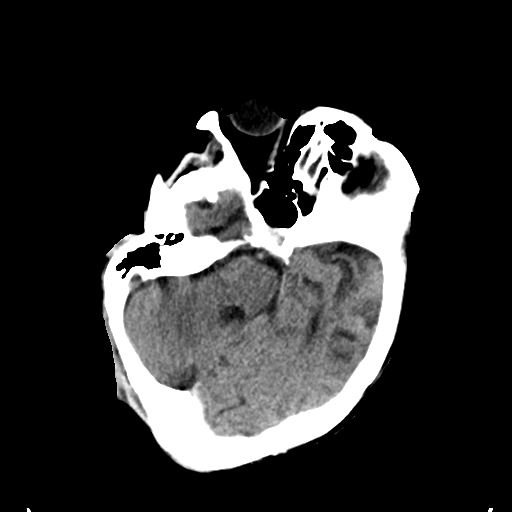
[im 15/29  brain]
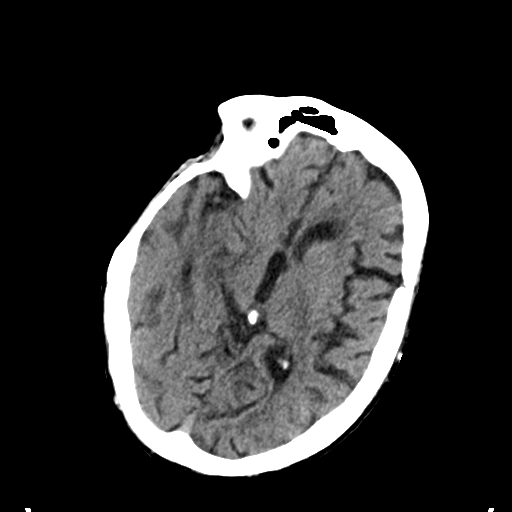
[im 18/29  brain]
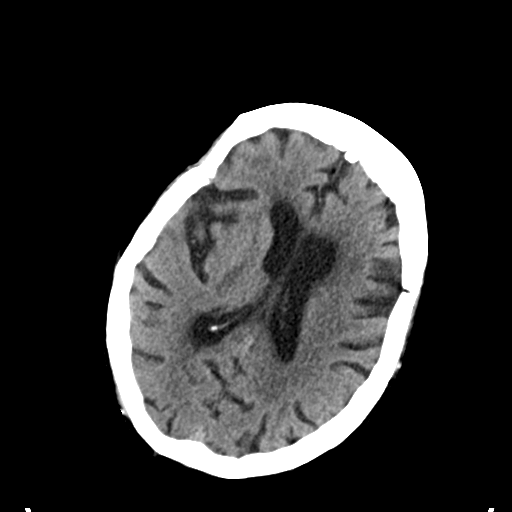
[im 18/29  bone]
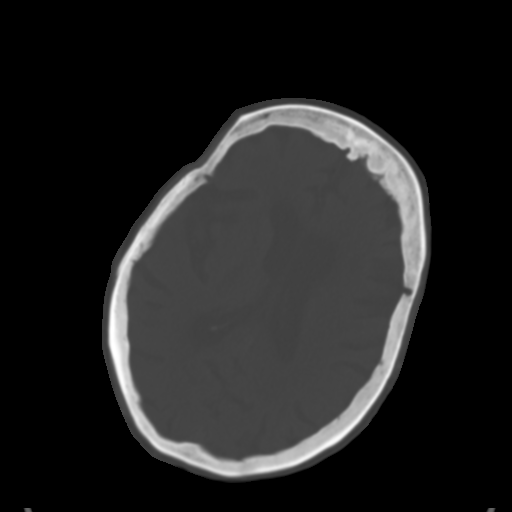
[im 22/29  brain]
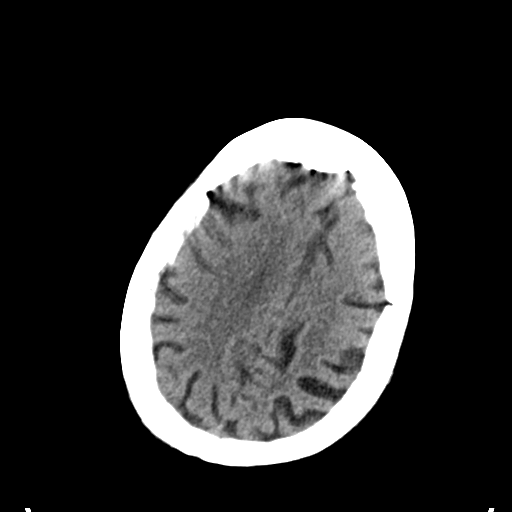
[im 25/29  brain]
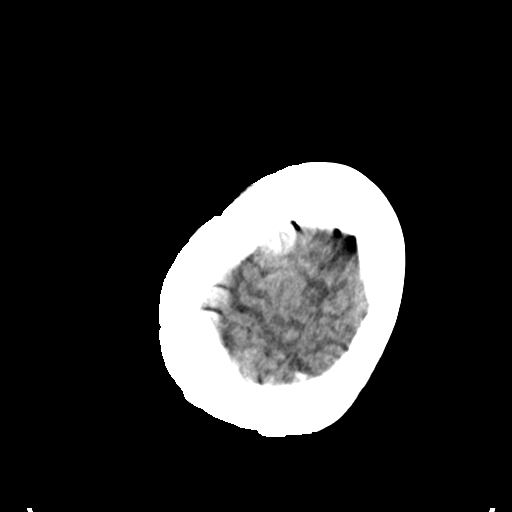

[Series 3: head wo · axial · 0.47mm/px · z∈[-18,-3]mm · 2 of 11 slices shown (2 of 2)]
[im 4/11  brain]
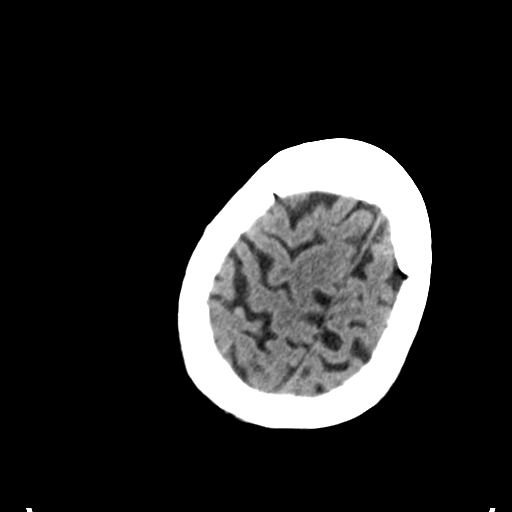
[im 7/11  brain]
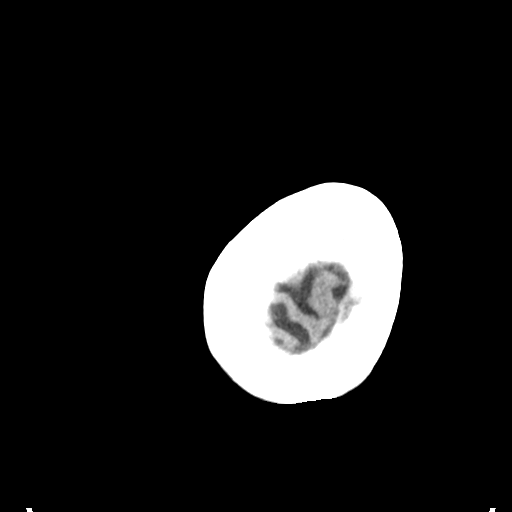

[Series 6: coronal soft tissue · coronal · 0.27mm/px · 3 of 62 slices shown]
[im 16/62  brain]
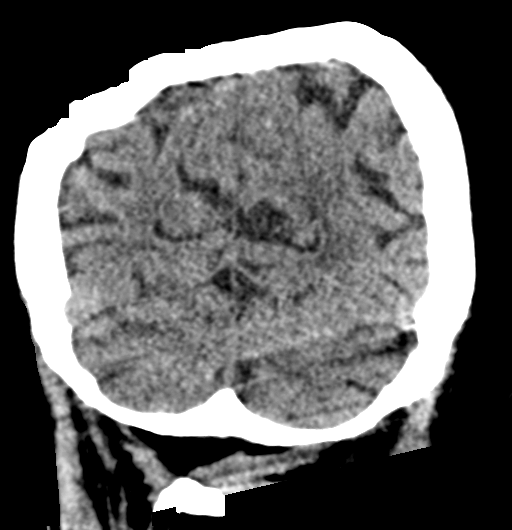
[im 31/62  brain]
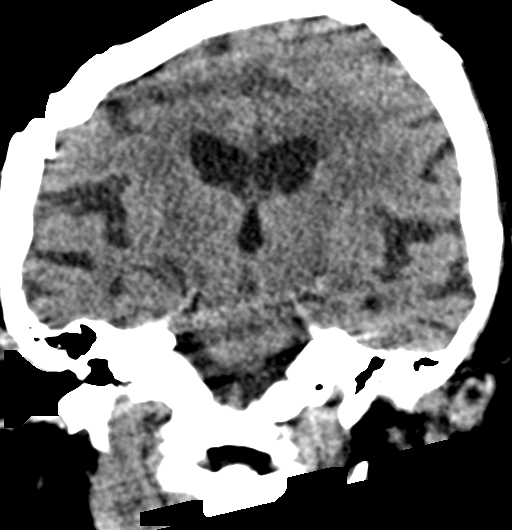
[im 46/62  brain]
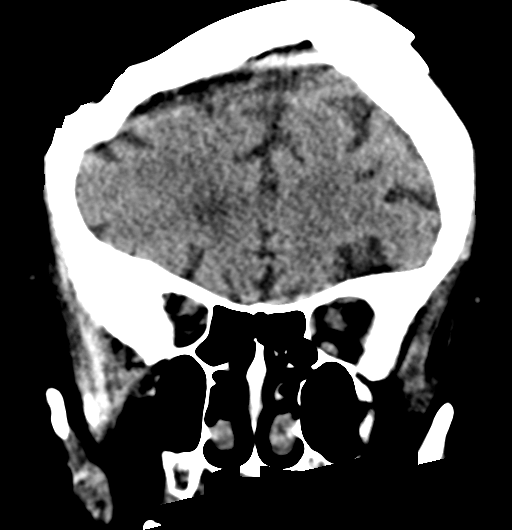

[Series 9: sagittal soft tissue · sagittal · 0.11mm/px · 2 of 42 slices shown]
[im 14/42  brain]
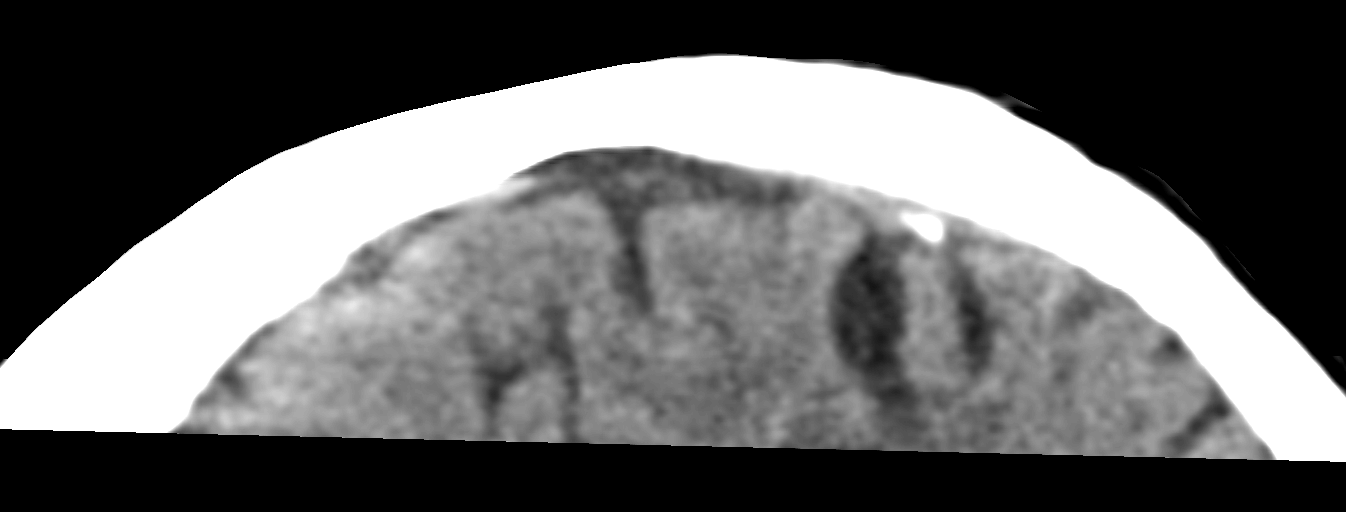
[im 28/42  brain]
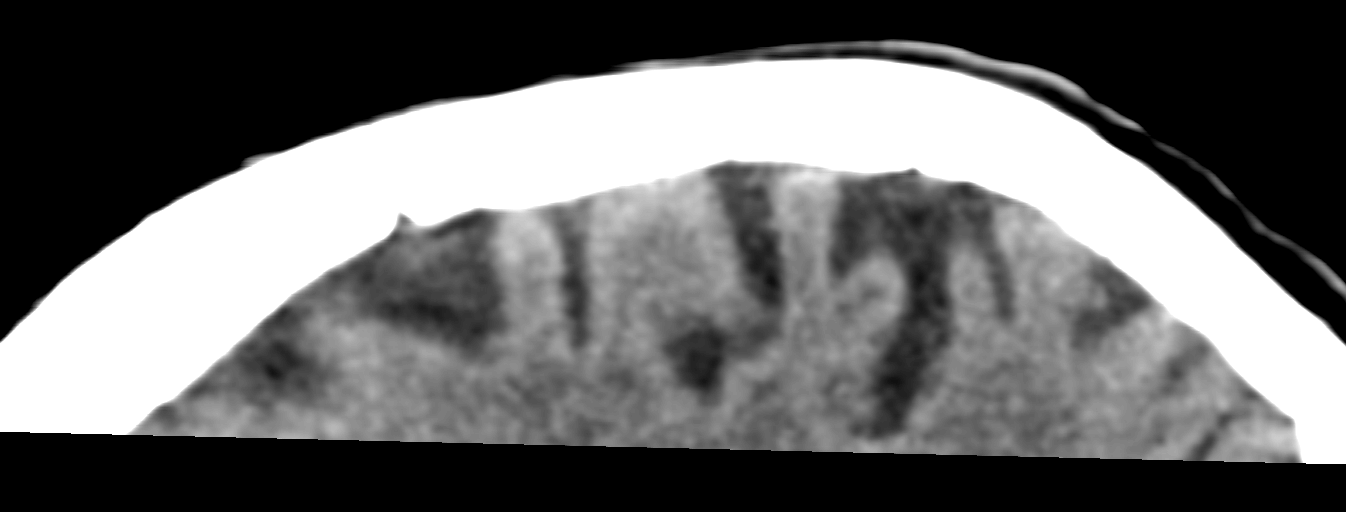

[14 of 47 positions shown; findings below may reference images not displayed]

FINDINGS: Brain: No mass lesion, intraparenchymal hemorrhage or extra-axial
collection. No evidence of acute cortical infarct. Periventricular
hypoattenuation suggesting chronic microvascular disease.

Vascular: No hyperdense vessel or unexpected vascular calcification.

Skull: Normal visualized skull base, calvarium and extracranial soft
tissues.

Sinuses/Orbits: No sinus fluid levels or advanced mucosal
thickening. No mastoid effusion. Normal orbits.
IMPRESSION: Chronic small vessel disease without acute intracranial abnormality.
